# Patient Record
Sex: Female | Born: 1990 | Race: White | Hispanic: Yes | Marital: Married | Smoking: Never smoker
Health system: Southern US, Community
[De-identification: ages and names within clinical notes are randomized; demographics above are authoritative.]

## PROBLEM LIST (undated history)

## (undated) DIAGNOSIS — Z789 Other specified health status: Secondary | ICD-10-CM

## (undated) HISTORY — PX: NO PAST SURGERIES: SHX2092

---
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
## 2009-02-28 ENCOUNTER — Emergency Department (HOSPITAL_COMMUNITY): Admission: EM | Admit: 2009-02-28 | Discharge: 2009-02-28 | Payer: Self-pay | Admitting: Emergency Medicine

## 2010-09-12 ENCOUNTER — Emergency Department (HOSPITAL_COMMUNITY)
Admission: EM | Admit: 2010-09-12 | Discharge: 2010-09-13 | Disposition: A | Discharge status: Home / Self Care | Payer: Self-pay | Attending: Emergency Medicine | Admitting: Emergency Medicine

## 2010-09-12 DIAGNOSIS — R109 Unspecified abdominal pain: Secondary | ICD-10-CM | POA: Insufficient documentation

## 2010-09-12 DIAGNOSIS — O4690 Antepartum hemorrhage, unspecified, unspecified trimester: Secondary | ICD-10-CM | POA: Insufficient documentation

## 2010-09-13 ENCOUNTER — Emergency Department (HOSPITAL_COMMUNITY): Payer: Self-pay

## 2010-09-13 LAB — URINALYSIS, ROUTINE W REFLEX MICROSCOPIC
Bilirubin Urine: NEGATIVE
Nitrite: NEGATIVE
Specific Gravity, Urine: 1.024 (ref 1.005–1.030)
Urobilinogen, UA: 0.2 mg/dL (ref 0.0–1.0)
pH: 5.5 (ref 5.0–8.0)

## 2010-09-13 LAB — WET PREP, GENITAL

## 2010-09-13 LAB — URINE MICROSCOPIC-ADD ON

## 2010-09-13 LAB — POCT I-STAT, CHEM 8
BUN: 10 mg/dL (ref 6–23)
Calcium, Ion: 1.18 mmol/L (ref 1.12–1.32)
Chloride: 104 mEq/L (ref 96–112)
Creatinine, Ser: 0.7 mg/dL (ref 0.4–1.2)
Glucose, Bld: 91 mg/dL (ref 70–99)
TCO2: 24 mmol/L (ref 0–100)

## 2010-09-13 LAB — GC/CHLAMYDIA PROBE AMP, GENITAL: GC Probe Amp, Genital: NEGATIVE

## 2010-09-13 LAB — POCT PREGNANCY, URINE: Preg Test, Ur: POSITIVE

## 2010-09-16 ENCOUNTER — Inpatient Hospital Stay (HOSPITAL_COMMUNITY)
Admission: AD | Admit: 2010-09-16 | Discharge: 2010-09-16 | Disposition: A | Discharge status: Home / Self Care | Payer: Self-pay | Source: Ambulatory Visit | Attending: Obstetrics & Gynecology | Admitting: Obstetrics & Gynecology

## 2010-09-16 ENCOUNTER — Inpatient Hospital Stay (HOSPITAL_COMMUNITY): Admission: AD | Admit: 2010-09-16 | Payer: Self-pay | Source: Ambulatory Visit | Admitting: Obstetrics & Gynecology

## 2010-09-16 DIAGNOSIS — O039 Complete or unspecified spontaneous abortion without complication: Secondary | ICD-10-CM | POA: Insufficient documentation

## 2010-09-18 ENCOUNTER — Inpatient Hospital Stay (HOSPITAL_COMMUNITY)
Admission: AD | Admit: 2010-09-18 | Discharge: 2010-09-18 | Disposition: A | Discharge status: Home / Self Care | Payer: Self-pay | Source: Ambulatory Visit | Attending: Obstetrics & Gynecology | Admitting: Obstetrics & Gynecology

## 2010-09-18 DIAGNOSIS — O209 Hemorrhage in early pregnancy, unspecified: Secondary | ICD-10-CM

## 2010-09-18 LAB — ABO/RH: ABO/RH(D): O POS

## 2010-09-20 ENCOUNTER — Inpatient Hospital Stay (HOSPITAL_COMMUNITY)
Admission: AD | Admit: 2010-09-20 | Discharge: 2010-09-20 | Disposition: A | Discharge status: Home / Self Care | Payer: Self-pay | Source: Ambulatory Visit | Attending: Obstetrics and Gynecology | Admitting: Obstetrics and Gynecology

## 2010-09-20 DIAGNOSIS — O00109 Unspecified tubal pregnancy without intrauterine pregnancy: Secondary | ICD-10-CM

## 2010-09-20 LAB — CBC
Hemoglobin: 11.5 g/dL — ABNORMAL LOW (ref 12.0–15.0)
MCH: 29.4 pg (ref 26.0–34.0)
MCV: 89.5 fL (ref 78.0–100.0)
RBC: 3.91 MIL/uL (ref 3.87–5.11)
WBC: 9.8 10*3/uL (ref 4.0–10.5)

## 2010-09-20 LAB — CREATININE, SERUM
Creatinine, Ser: 0.54 mg/dL (ref 0.4–1.2)
GFR calc Af Amer: >60 mL/min (ref >60)
GFR calc non Af Amer: >60 mL/min (ref >60)

## 2010-09-20 LAB — DIFFERENTIAL
Basophils Relative: 0 % (ref 0–1)
Lymphs Abs: 1.8 10*3/uL (ref 0.7–4.0)
Monocytes Relative: 7 % (ref 3–12)
Neutro Abs: 7.2 10*3/uL (ref 1.7–7.7)
Neutrophils Relative %: 74 % (ref 43–77)

## 2010-09-20 LAB — HCG, QUANTITATIVE, PREGNANCY: hCG, Beta Chain, Quant, S: 54 m[IU]/mL — ABNORMAL HIGH (ref <5)

## 2010-09-20 LAB — AST: AST: 12 U/L (ref 0–37)

## 2010-09-23 ENCOUNTER — Inpatient Hospital Stay (HOSPITAL_COMMUNITY): Admission: AD | Admit: 2010-09-23 | Payer: Self-pay | Source: Ambulatory Visit | Admitting: Obstetrics and Gynecology

## 2010-09-23 ENCOUNTER — Inpatient Hospital Stay (HOSPITAL_COMMUNITY)
Admission: AD | Admit: 2010-09-23 | Discharge: 2010-09-23 | Disposition: A | Discharge status: Home / Self Care | Payer: Self-pay | Source: Ambulatory Visit | Attending: Obstetrics and Gynecology | Admitting: Obstetrics and Gynecology

## 2010-09-23 DIAGNOSIS — O00109 Unspecified tubal pregnancy without intrauterine pregnancy: Secondary | ICD-10-CM | POA: Insufficient documentation

## 2010-09-26 ENCOUNTER — Inpatient Hospital Stay (HOSPITAL_COMMUNITY)
Admission: EM | Admit: 2010-09-26 | Discharge: 2010-09-26 | Disposition: A | Discharge status: Home / Self Care | Payer: Self-pay | Source: Ambulatory Visit | Attending: Obstetrics & Gynecology | Admitting: Obstetrics & Gynecology

## 2010-09-26 DIAGNOSIS — O00109 Unspecified tubal pregnancy without intrauterine pregnancy: Secondary | ICD-10-CM | POA: Insufficient documentation

## 2010-10-03 ENCOUNTER — Inpatient Hospital Stay (HOSPITAL_COMMUNITY)
Admission: AD | Admit: 2010-10-03 | Discharge: 2010-10-03 | Disposition: A | Discharge status: Home / Self Care | Payer: Self-pay | Source: Ambulatory Visit | Attending: Obstetrics & Gynecology | Admitting: Obstetrics & Gynecology

## 2010-10-03 DIAGNOSIS — O039 Complete or unspecified spontaneous abortion without complication: Secondary | ICD-10-CM

## 2010-10-03 LAB — HCG, QUANTITATIVE, PREGNANCY: hCG, Beta Chain, Quant, S: 1 m[IU]/mL (ref <5)

## 2010-10-22 ENCOUNTER — Ambulatory Visit: Payer: Self-pay | Admitting: Family Medicine

## 2011-01-28 LAB — CBC
HCT: 34.2 — ABNORMAL LOW
Hemoglobin: 11.5 — ABNORMAL LOW
MCHC: 33.7
MCHC: 34.2
MCV: 88.7
Platelets: 204
Platelets: 229
RBC: 3.28 — ABNORMAL LOW
RDW: 13
RDW: 13.1

## 2012-07-16 IMAGING — US US OB COMP LESS 14 WK
1 series · 14 of 28 positions shown · non-contrast
Comparison: None.

CLINICAL DATA: Pregnant patient with pain.  Quantitative HCG 128.

OBSTETRIC <14 WK US AND TRANSVAGINAL OB US
TECHNIQUE: Both transabdominal and transvaginal ultrasound
examinations were performed for complete evaluation of the
gestation as well as the maternal uterus, adnexal regions, and
pelvic cul-de-sac.  Transvaginal technique was performed to assess
early pregnancy.

[Series 1: us ob comp less 14 wk · 0.26mm/px · 14 of 40 slices shown]
[im 2/40]
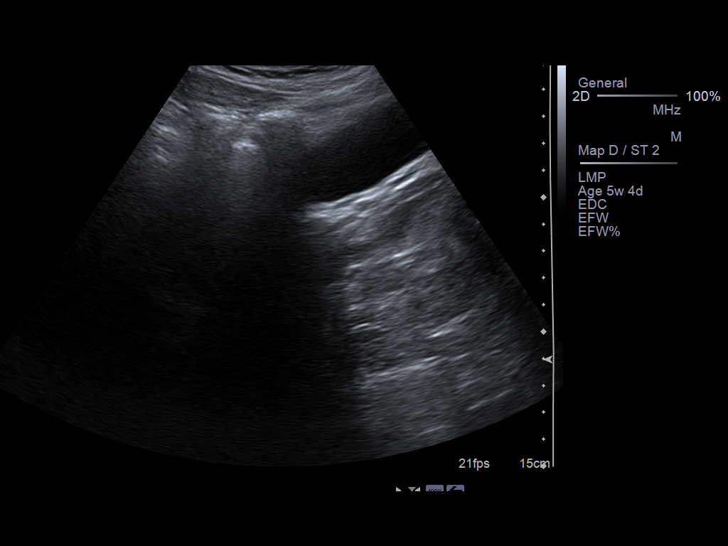
[im 5/40]
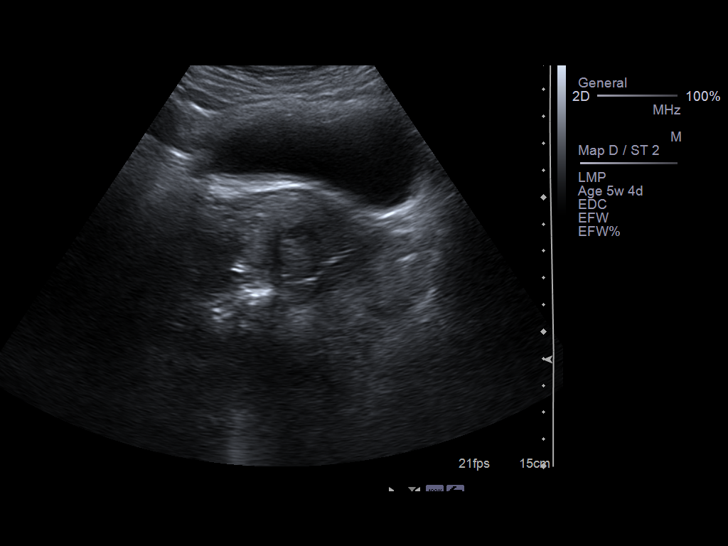
[im 8/40]
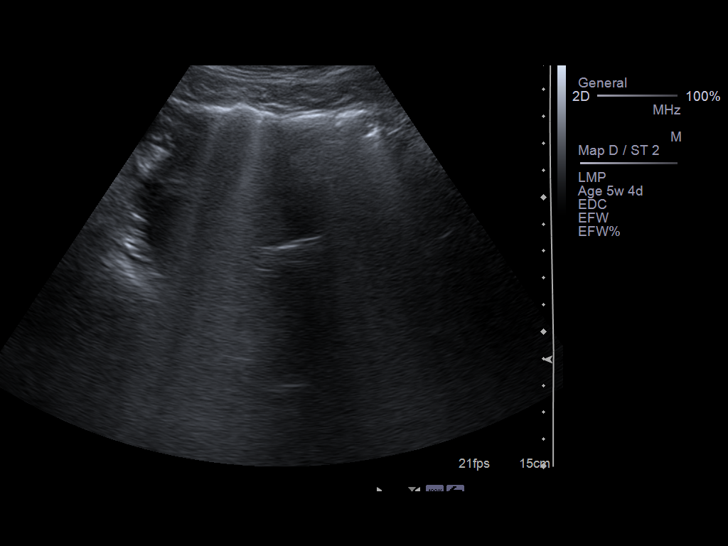
[im 11/40]
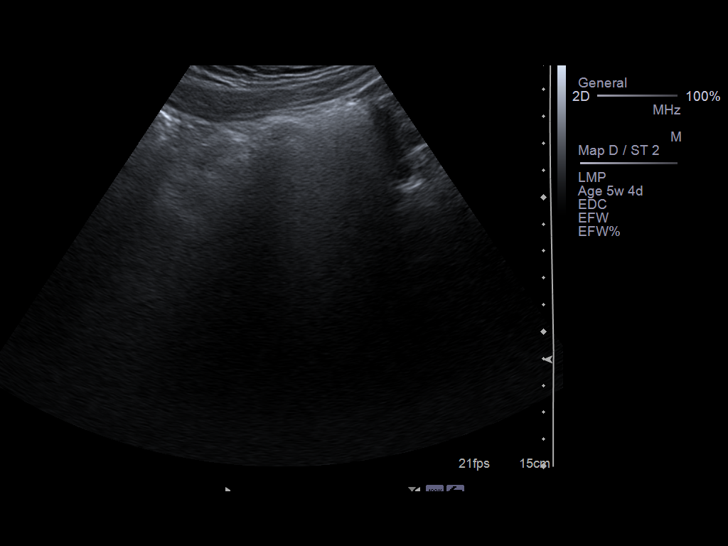
[im 14/40]
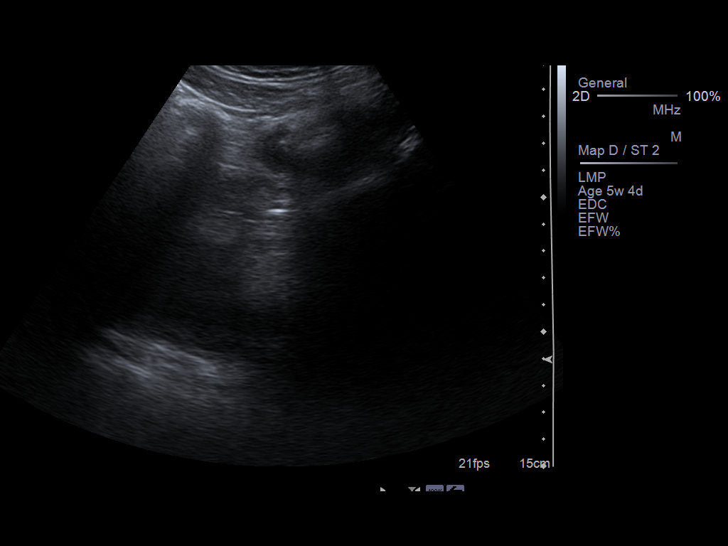
[im 16/40]
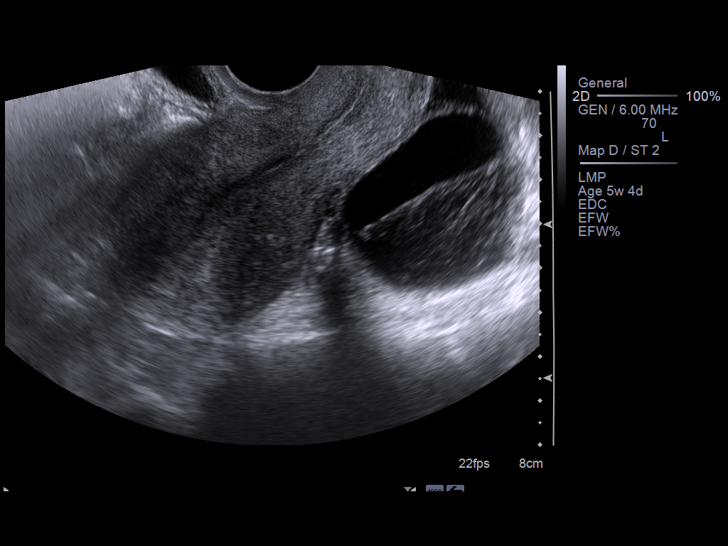
[im 19/40]
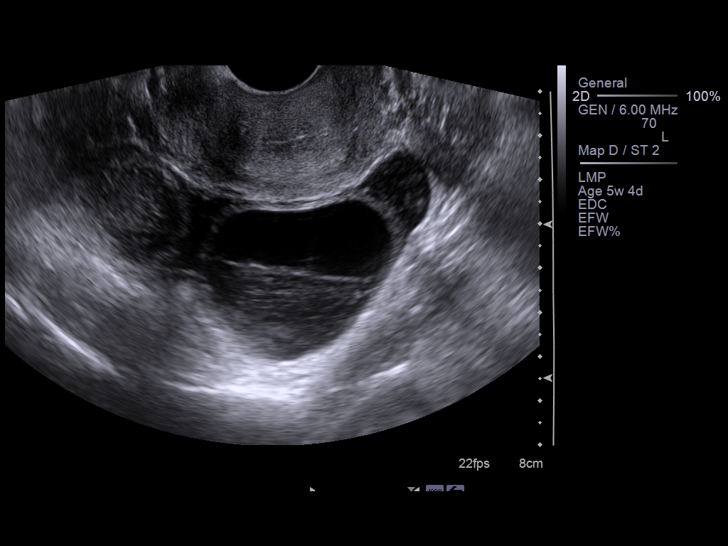
[im 22/40]
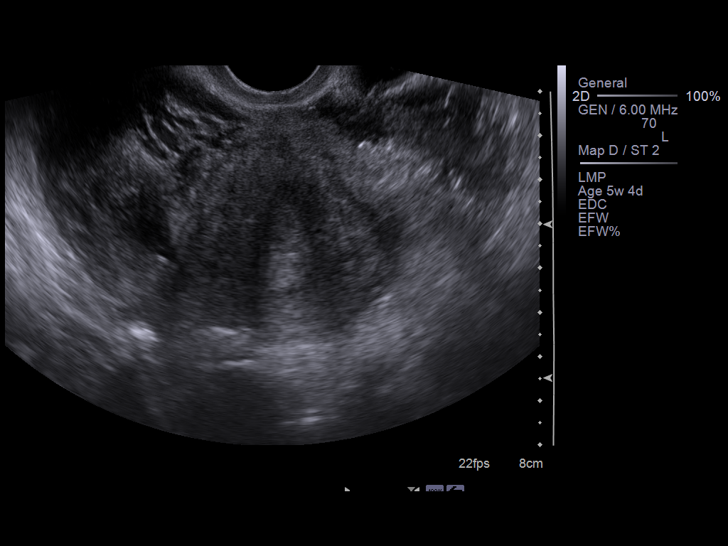
[im 25/40]
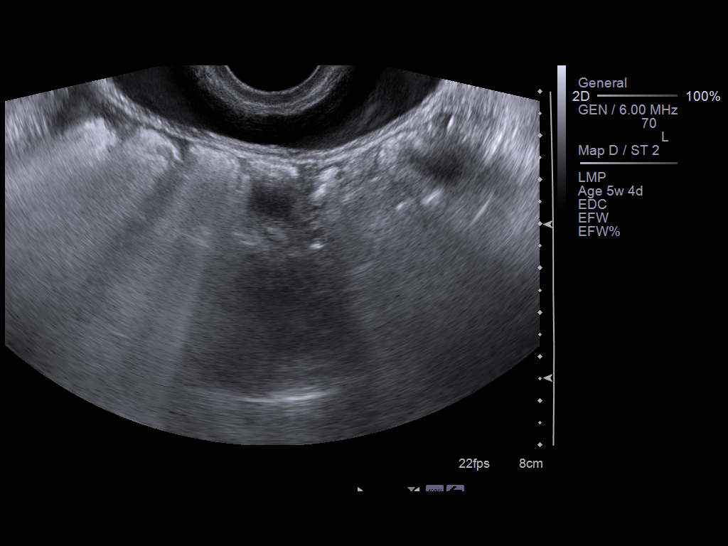
[im 28/40]
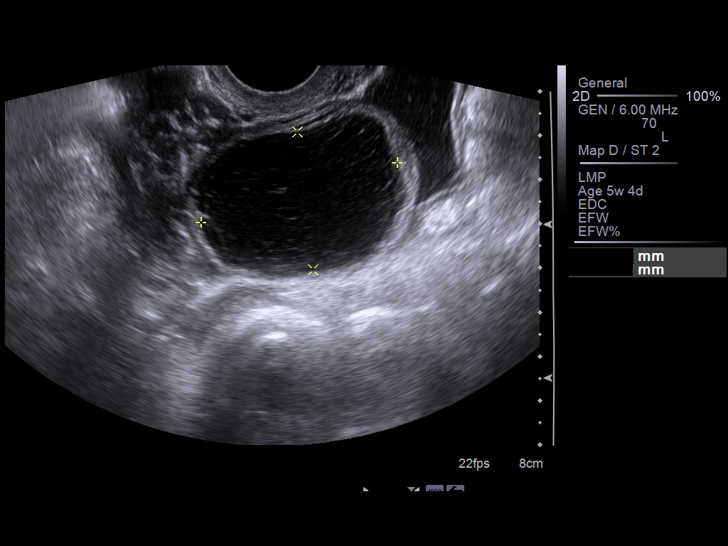
[im 31/40]
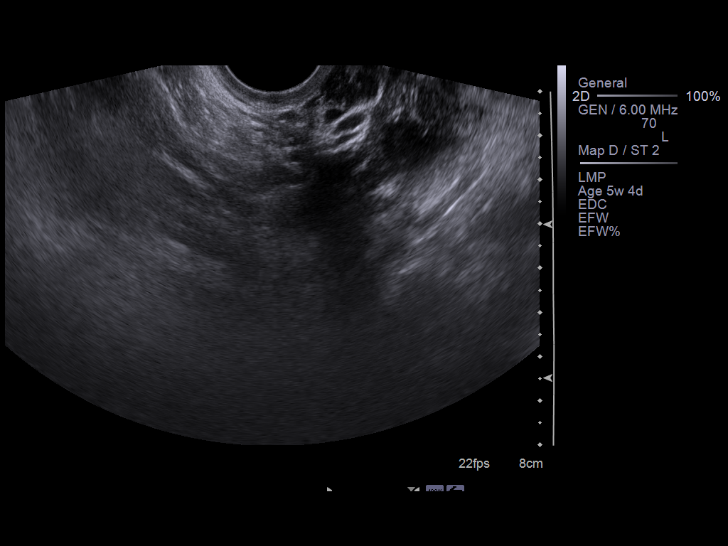
[im 34/40]
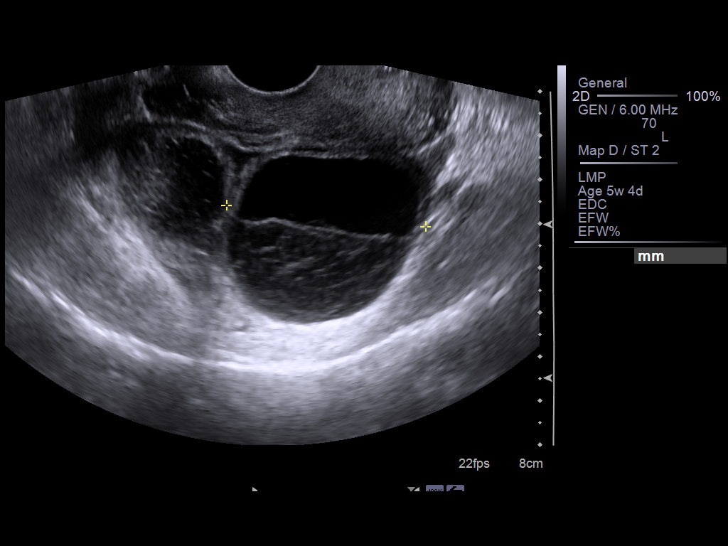
[im 37/40]
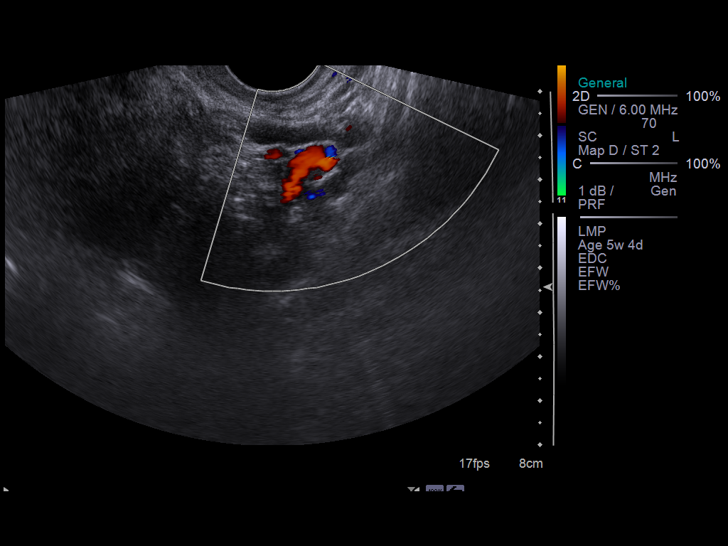
[im 40/40]
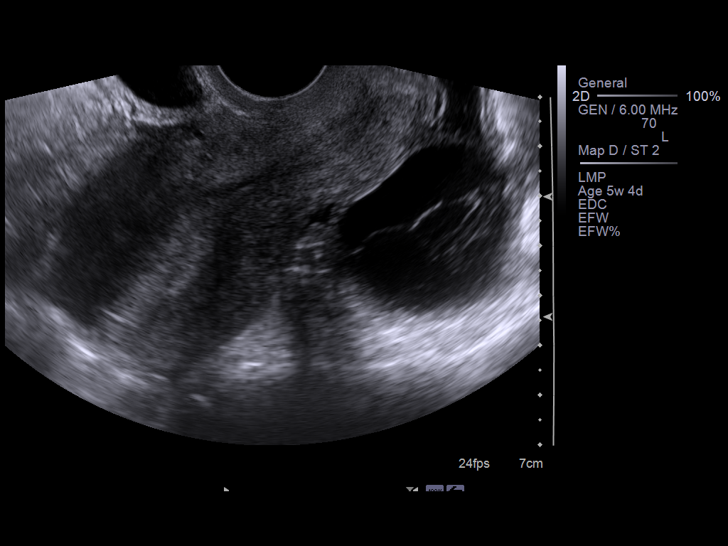

[14 of 28 positions shown; findings below may reference images not displayed]

FINDINGS: No intrauterine pregnancy is identified.  The right
ovary measures 5.5 x 3.8 x 4.8 cm.  There is a right ovarian cyst
measuring 4.6 x 3.1 x 3.9 cm with multiple internal Samsonaite echoes.
The left ovary measures 5.1 x 3.9 x 4.5 cm.  A cyst with multiple
Samsonaite internal echoes measures 4.6 x 3.6 x 4.0 cm.  There is a fluid
debris level present.  Small amount of free pelvic fluid is noted.
IMPRESSION: 1.  No evidence of pregnancy.  Given low quantitative HCG,
differential considerations include early IUP, ectopic pregnancy or
spontaneous abortion.  Recommend follow-up serial quantitative HCG.
2.  Bilateral cysts in the ovaries with an appearance most
compatible with hemorrhagic cysts.  Follow-up pelvic ultrasound in
6 weeks is recommended.

## 2012-09-23 ENCOUNTER — Inpatient Hospital Stay (HOSPITAL_COMMUNITY): Payer: Self-pay

## 2012-09-23 ENCOUNTER — Inpatient Hospital Stay (HOSPITAL_COMMUNITY)
Admission: AD | Admit: 2012-09-23 | Discharge: 2012-09-23 | Disposition: A | Discharge status: Home / Self Care | Payer: Self-pay | Source: Ambulatory Visit | Attending: Obstetrics and Gynecology | Admitting: Obstetrics and Gynecology

## 2012-09-23 ENCOUNTER — Encounter (HOSPITAL_COMMUNITY): Payer: Self-pay | Admitting: *Deleted

## 2012-09-23 DIAGNOSIS — O209 Hemorrhage in early pregnancy, unspecified: Secondary | ICD-10-CM | POA: Insufficient documentation

## 2012-09-23 DIAGNOSIS — O210 Mild hyperemesis gravidarum: Secondary | ICD-10-CM | POA: Insufficient documentation

## 2012-09-23 DIAGNOSIS — O418X10 Other specified disorders of amniotic fluid and membranes, first trimester, not applicable or unspecified: Secondary | ICD-10-CM

## 2012-09-23 DIAGNOSIS — O468X9 Other antepartum hemorrhage, unspecified trimester: Secondary | ICD-10-CM

## 2012-09-23 DIAGNOSIS — R109 Unspecified abdominal pain: Secondary | ICD-10-CM | POA: Insufficient documentation

## 2012-09-23 HISTORY — DX: Other specified health status: Z78.9

## 2012-09-23 LAB — POCT PREGNANCY, URINE: Preg Test, Ur: POSITIVE — AB

## 2012-09-23 LAB — CBC
HCT: 33.2 % — ABNORMAL LOW (ref 36.0–46.0)
MCV: 87.4 fL (ref 78.0–100.0)
RBC: 3.8 MIL/uL — ABNORMAL LOW (ref 3.87–5.11)
RDW: 13.2 % (ref 11.5–15.5)
WBC: 10.1 10*3/uL (ref 4.0–10.5)

## 2012-09-23 LAB — URINALYSIS, ROUTINE W REFLEX MICROSCOPIC
Glucose, UA: NEGATIVE mg/dL
Ketones, ur: NEGATIVE mg/dL
Leukocytes, UA: NEGATIVE
Nitrite: NEGATIVE
Protein, ur: NEGATIVE mg/dL
Urobilinogen, UA: 0.2 mg/dL (ref 0.0–1.0)

## 2012-09-23 LAB — WET PREP, GENITAL: Clue Cells Wet Prep HPF POC: NONE SEEN

## 2012-09-23 MED ORDER — PROMETHAZINE HCL 25 MG PO TABS: 25.0000 mg | ORAL_TABLET | Freq: Four times a day (QID) | ORAL | Status: DC | PRN

## 2012-09-23 MED ORDER — ONDANSETRON HCL 4 MG PO TABS: 4.0000 mg | ORAL_TABLET | Freq: Four times a day (QID) | ORAL | Status: DC

## 2012-09-23 NOTE — Progress Notes (Signed)
Raynelle Fanning Either PA in to discuss lab and u/s results with pt. Written and verbal d/c instructions given and understanding voiced.

## 2012-09-23 NOTE — MAU Note (Signed)
Vaginal bleeding started couple hours ago. Dark red when wipes. Has had cramps in past but not today. Dizziness sometimes.

## 2012-09-23 NOTE — MAU Provider Note (Signed)
History     CSN: [id number]  Arrival date and time: 09/23/12 2129   First Provider Initiated Contact with Patient 09/23/12 2206      Chief Complaint  Patient presents with  . Vaginal Bleeding   HPI Ms. Jennifer Watson is a 22 y.o. [phone] at [redacted]w[redacted]d who presents to MAU today with complaint of vaginal bleeding. The patient states that she started bleeding 2-3 hours ago. She states that it is a little less than a normal period. She denies pain, although she has had occasional lower abdominal cramps recently. She has also had N/V regularly over the last few weeks, but none now. She has had dizziness and dysuria, frequency and urgency today. She denies other vaginal discharge, diarrhea, constipation or fever.   OB History   Grav Para Term Preterm Abortions TAB SAB Ect Mult Living   5 2 2  1  1   2       Past Medical History  Diagnosis Date  . Medical history non-contributory     Past Surgical History  Procedure Laterality Date  . No past surgeries      Family History  Problem Relation Age of Onset  . Cancer Maternal Grandfather   . Diabetes Paternal Grandmother     History  Substance Use Topics  . Smoking status: Never Smoker   . Smokeless tobacco: Not on file  . Alcohol Use: No    Allergies: No Known Allergies  No prescriptions prior to admission    Review of Systems  Constitutional: Negative for fever and malaise/fatigue.  Gastrointestinal: Positive for nausea and vomiting. Negative for abdominal pain, diarrhea and constipation.  Genitourinary: Positive for dysuria, urgency and frequency.       Neg - vaginal discharge + vaginal bleeding  Neurological: Positive for dizziness. Negative for loss of consciousness.   Physical Exam   Blood pressure 114/59, pulse 61, temperature 98.3 F (36.8 C), temperature source Oral, resp. rate 18, last menstrual period 08/14/2012, SpO2 100.00%.  Physical Exam  Constitutional: She is oriented to person, place, and time.  She appears well-developed and well-nourished. No distress.  HENT:  Head: Normocephalic and atraumatic.  Cardiovascular: Normal rate, regular rhythm and normal heart sounds.   Respiratory: Effort normal and breath sounds normal. No respiratory distress.  GI: Soft. Bowel sounds are normal. She exhibits no distension and no mass. There is tenderness (mild tenderness to palpation of the LLQ). There is no rebound and no guarding.  Genitourinary: Uterus is enlarged. Uterus is not tender. Cervix exhibits no motion tenderness, no discharge and no friability. Right adnexum displays no mass and no tenderness. Left adnexum displays no mass and no tenderness. There is bleeding (small amount of bleeding noted in the vagina) around the vagina. No vaginal discharge found.  Neurological: She is alert and oriented to person, place, and time.  Skin: Skin is warm and dry. No erythema.  Psychiatric: She has a normal mood and affect.   Results for orders placed during the hospital encounter of 09/23/12 (from the past 24 hour(s))  URINALYSIS, ROUTINE W REFLEX MICROSCOPIC     Status: Abnormal   Collection Time    09/23/12  9:46 PM      Result Value Range   Color, Urine YELLOW  YELLOW   APPearance CLEAR  CLEAR   Specific Gravity, Urine 1.025  1.005 - 1.030   pH 6.0  5.0 - 8.0   Glucose, UA NEGATIVE  NEGATIVE mg/dL   Hgb urine dipstick LARGE (*) NEGATIVE  Bilirubin Urine NEGATIVE  NEGATIVE   Ketones, ur NEGATIVE  NEGATIVE mg/dL   Protein, ur NEGATIVE  NEGATIVE mg/dL   Urobilinogen, UA 0.2  0.0 - 1.0 mg/dL   Nitrite NEGATIVE  NEGATIVE   Leukocytes, UA NEGATIVE  NEGATIVE  URINE MICROSCOPIC-ADD ON     Status: Abnormal   Collection Time    09/23/12  9:46 PM      Result Value Range   Squamous Epithelial / LPF RARE  RARE   WBC, UA    <3 WBC/hpf   Value: NO FORMED ELEMENTS SEEN ON URINE MICROSCOPIC EXAMINATION   RBC / HPF 3-6  <3 RBC/hpf   Bacteria, UA FEW (*) RARE  POCT PREGNANCY, URINE     Status: Abnormal     Collection Time    09/23/12 10:05 PM      Result Value Range   Preg Test, Ur POSITIVE (*) NEGATIVE  WET PREP, GENITAL     Status: None   Collection Time    09/23/12 10:26 PM      Result Value Range   Yeast Wet Prep HPF POC NONE SEEN  NONE SEEN   Trich, Wet Prep NONE SEEN  NONE SEEN   Clue Cells Wet Prep HPF POC NONE SEEN  NONE SEEN   WBC, Wet Prep HPF POC NONE SEEN  NONE SEEN  CBC     Status: Abnormal   Collection Time    09/23/12 10:26 PM      Result Value Range   WBC 10.1  4.0 - 10.5 K/uL   RBC 3.80 (*) 3.87 - 5.11 MIL/uL   Hemoglobin 11.4 (*) 12.0 - 15.0 g/dL   HCT 08.6 (*) 57.8 - 46.9 %   MCV 87.4  78.0 - 100.0 fL   MCH 30.0  26.0 - 34.0 pg   MCHC 34.3  30.0 - 36.0 g/dL   RDW 62.9  52.8 - 41.3 %   Platelets 255  150 - 400 K/uL  HCG, QUANTITATIVE, PREGNANCY     Status: Abnormal   Collection Time    09/23/12 10:27 PM      Result Value Range   hCG, Beta Chain, Quant, S [id number] (*) <5 mIU/mL      MAU Course  Procedures None  MDM +UPT UA, Wet prep, GC/Chlamydia, CBC, quant hCG and [place] today O+ blood type from previous visit in Epic  Assessment and Plan  A: Bleeding in early pregnancy Nausea and vomiting in early pregnancy Subchorionic hemorrhage  P: Discharge home Bleeding precautions discussed Rx for Phenergan and Zofran sent to patient's pharmacy Pregnancy confirmation letter given with list of OB providers and Medicaid instructions Patient may return to MAU as needed or if her condition were to change or worsen  Freddi Starr, PA-C  09/23/2012, 11:30 PM

## 2012-09-24 LAB — GC/CHLAMYDIA PROBE AMP: GC Probe RNA: NEGATIVE

## 2012-09-25 NOTE — MAU Provider Note (Signed)
Attestation of Attending Supervision of Advanced Practitioner: Evaluation and management procedures were performed by the PA/NP/CNM/OB Fellow under my supervision/collaboration. Chart reviewed and agree with management and plan.  Tilda Burrow 09/25/2012 6:26 PM

## 2013-01-02 ENCOUNTER — Other Ambulatory Visit (HOSPITAL_COMMUNITY): Payer: Self-pay | Admitting: Nurse Practitioner

## 2013-01-02 DIAGNOSIS — Z3689 Encounter for other specified antenatal screening: Secondary | ICD-10-CM

## 2013-01-02 LAB — OB RESULTS CONSOLE HEPATITIS B SURFACE ANTIGEN: HEP B S AG: NEGATIVE

## 2013-01-02 LAB — OB RESULTS CONSOLE ANTIBODY SCREEN: ANTIBODY SCREEN: NEGATIVE

## 2013-01-02 LAB — OB RESULTS CONSOLE HIV ANTIBODY (ROUTINE TESTING): HIV: NONREACTIVE

## 2013-01-02 LAB — OB RESULTS CONSOLE RPR
RPR: NONREACTIVE
RPR: NONREACTIVE

## 2013-01-02 LAB — OB RESULTS CONSOLE GC/CHLAMYDIA
Chlamydia: NEGATIVE
Gonorrhea: NEGATIVE

## 2013-01-02 LAB — OB RESULTS CONSOLE VARICELLA ZOSTER ANTIBODY, IGG: Varicella: NON-IMMUNE/NOT IMMUNE

## 2013-01-02 LAB — OB RESULTS CONSOLE RUBELLA ANTIBODY, IGM: Rubella: IMMUNE

## 2013-01-03 ENCOUNTER — Other Ambulatory Visit (HOSPITAL_COMMUNITY): Payer: Self-pay | Admitting: Nurse Practitioner

## 2013-01-03 ENCOUNTER — Ambulatory Visit (HOSPITAL_COMMUNITY)
Admission: RE | Admit: 2013-01-03 | Discharge: 2013-01-03 | Disposition: A | Discharge status: Home / Self Care | Payer: Medicaid Other | Source: Ambulatory Visit | Attending: Nurse Practitioner | Admitting: Nurse Practitioner

## 2013-01-03 DIAGNOSIS — Z3689 Encounter for other specified antenatal screening: Secondary | ICD-10-CM | POA: Insufficient documentation

## 2013-04-24 LAB — OB RESULTS CONSOLE GBS: STREP GROUP B AG: NEGATIVE

## 2013-04-27 NOTE — L&D Delivery Note (Signed)
Rapid progression of mother once arrived to L&D. AROM at bedside and pt pushed several times to a successful NSVD over intact perinum. Active manamgent of 3rd stage with pit and traction delivered intact placenta with 3v cord. No tears, Hemostatic, EBL 300, counts correct. I was present for the entire delivery. Dr. Richarda BladeAdamo was provider delivering.  Tawana ScaleMichael Ryan Nyjae Hodge, MD OB Fellow

## 2013-04-27 NOTE — L&D Delivery Note (Signed)
Delivery Note At 11:06 AM a viable and healthy female was delivered via Vaginal, Spontaneous Delivery (Presentation: Left Occiput Anterior).  APGAR: 9, 9; weight pending.   Placenta status: delivered, intact, .  Cord: 3 vessels with the following complications: none.   Anesthesia: Epidural, IV fentanyl  Episiotomy: None Lacerations: none Suture Repair: na Est. Blood Loss (mL): 300  Mom to postpartum.  Baby to Couplet care / Skin to Skin. Placenta to birthing suites  Beverely Lowdamo, Elena 05/11/2013, 11:20 AM

## 2013-05-11 ENCOUNTER — Encounter (HOSPITAL_COMMUNITY): Payer: Medicaid Other | Admitting: Anesthesiology

## 2013-05-11 ENCOUNTER — Inpatient Hospital Stay (HOSPITAL_COMMUNITY): Payer: Medicaid Other | Admitting: Anesthesiology

## 2013-05-11 ENCOUNTER — Inpatient Hospital Stay (HOSPITAL_COMMUNITY)
Admission: AD | Admit: 2013-05-11 | Discharge: 2013-05-12 | DRG: 775 | Disposition: A | Discharge status: Home / Self Care | Payer: Medicaid Other | Source: Ambulatory Visit | Attending: Obstetrics & Gynecology | Admitting: Obstetrics & Gynecology

## 2013-05-11 ENCOUNTER — Encounter (HOSPITAL_COMMUNITY): Payer: Self-pay | Admitting: *Deleted

## 2013-05-11 DIAGNOSIS — IMO0001 Reserved for inherently not codable concepts without codable children: Secondary | ICD-10-CM

## 2013-05-11 LAB — CBC
HEMATOCRIT: 35.6 % — AB (ref 36.0–46.0)
Hemoglobin: 12.3 g/dL (ref 12.0–15.0)
MCH: 30.8 pg (ref 26.0–34.0)
MCHC: 34.6 g/dL (ref 30.0–36.0)
MCV: 89 fL (ref 78.0–100.0)
Platelets: 227 10*3/uL (ref 150–400)
RBC: 4 MIL/uL (ref 3.87–5.11)
RDW: 13.1 % (ref 11.5–15.5)
WBC: 10.4 10*3/uL (ref 4.0–10.5)

## 2013-05-11 LAB — TYPE AND SCREEN
ABO/RH(D): O POS
ANTIBODY SCREEN: NEGATIVE

## 2013-05-11 LAB — HIV ANTIBODY (ROUTINE TESTING W REFLEX): HIV: NONREACTIVE

## 2013-05-11 LAB — RPR: RPR Ser Ql: NONREACTIVE

## 2013-05-11 MED ORDER — ONDANSETRON HCL 4 MG/2ML IJ SOLN: 4.0000 mg | Freq: Four times a day (QID) | INTRAMUSCULAR | Status: DC | PRN

## 2013-05-11 MED ORDER — LACTATED RINGERS IV SOLN
500.0000 mL | Freq: Once | INTRAVENOUS | Status: AC
  Administered 2013-05-11: 500 mL via INTRAVENOUS

## 2013-05-11 MED ORDER — ZOLPIDEM TARTRATE 5 MG PO TABS: 5.0000 mg | ORAL_TABLET | Freq: Every evening | ORAL | Status: DC | PRN

## 2013-05-11 MED ORDER — FENTANYL CITRATE 0.05 MG/ML IJ SOLN
100.0000 ug | INTRAMUSCULAR | Status: DC | PRN
  Administered 2013-05-11: 100 ug via INTRAVENOUS

## 2013-05-11 MED ORDER — FENTANYL CITRATE 0.05 MG/ML IJ SOLN
INTRAMUSCULAR | Status: AC
  Filled 2013-05-11: qty 2

## 2013-05-11 MED ORDER — DIPHENHYDRAMINE HCL 50 MG/ML IJ SOLN: 12.5000 mg | INTRAMUSCULAR | Status: DC | PRN

## 2013-05-11 MED ORDER — DIPHENHYDRAMINE HCL 25 MG PO CAPS: 25.0000 mg | ORAL_CAPSULE | Freq: Four times a day (QID) | ORAL | Status: DC | PRN

## 2013-05-11 MED ORDER — OXYCODONE-ACETAMINOPHEN 5-325 MG PO TABS: 1.0000 | ORAL_TABLET | ORAL | Status: DC | PRN

## 2013-05-11 MED ORDER — IBUPROFEN 600 MG PO TABS
600.0000 mg | ORAL_TABLET | Freq: Four times a day (QID) | ORAL | Status: DC | PRN
Start: 2013-05-11 — End: 2013-05-11

## 2013-05-11 MED ORDER — OXYTOCIN 40 UNITS IN LACTATED RINGERS INFUSION - SIMPLE MED
62.5000 mL/h | INTRAVENOUS | Status: DC
  Administered 2013-05-11: 62.5 mL/h via INTRAVENOUS
  Filled 2013-05-11: qty 1000

## 2013-05-11 MED ORDER — PRENATAL MULTIVITAMIN CH
1.0000 | ORAL_TABLET | Freq: Every day | ORAL | Status: DC
  Administered 2013-05-12: 1 via ORAL
  Filled 2013-05-11: qty 1

## 2013-05-11 MED ORDER — EPHEDRINE 5 MG/ML INJ
10.0000 mg | INTRAVENOUS | Status: DC | PRN
  Filled 2013-05-11: qty 2

## 2013-05-11 MED ORDER — ONDANSETRON HCL 4 MG PO TABS: 4.0000 mg | ORAL_TABLET | ORAL | Status: DC | PRN

## 2013-05-11 MED ORDER — FENTANYL 2.5 MCG/ML BUPIVACAINE 1/10 % EPIDURAL INFUSION (WH - ANES)
INTRAMUSCULAR | Status: DC | PRN
  Administered 2013-05-11: 14 mL/h via EPIDURAL

## 2013-05-11 MED ORDER — CITRIC ACID-SODIUM CITRATE 334-500 MG/5ML PO SOLN: 30.0000 mL | ORAL | Status: DC | PRN

## 2013-05-11 MED ORDER — TETANUS-DIPHTH-ACELL PERTUSSIS 5-2.5-18.5 LF-MCG/0.5 IM SUSP: 0.5000 mL | Freq: Once | INTRAMUSCULAR | Status: DC

## 2013-05-11 MED ORDER — SIMETHICONE 80 MG PO CHEW: 80.0000 mg | CHEWABLE_TABLET | ORAL | Status: DC | PRN

## 2013-05-11 MED ORDER — LACTATED RINGERS IV SOLN
500.0000 mL | INTRAVENOUS | Status: DC | PRN
  Administered 2013-05-11: 500 mL via INTRAVENOUS

## 2013-05-11 MED ORDER — DIBUCAINE 1 % RE OINT: 1.0000 "application " | TOPICAL_OINTMENT | RECTAL | Status: DC | PRN

## 2013-05-11 MED ORDER — EPHEDRINE 5 MG/ML INJ
10.0000 mg | INTRAVENOUS | Status: DC | PRN
  Filled 2013-05-11: qty 4
  Filled 2013-05-11: qty 2

## 2013-05-11 MED ORDER — IBUPROFEN 600 MG PO TABS
600.0000 mg | ORAL_TABLET | Freq: Four times a day (QID) | ORAL | Status: DC
  Administered 2013-05-11 – 2013-05-12 (×3): 600 mg via ORAL
  Filled 2013-05-11 (×3): qty 1

## 2013-05-11 MED ORDER — LIDOCAINE HCL (PF) 1 % IJ SOLN
30.0000 mL | INTRAMUSCULAR | Status: DC | PRN
  Filled 2013-05-11 (×2): qty 30

## 2013-05-11 MED ORDER — PHENYLEPHRINE 40 MCG/ML (10ML) SYRINGE FOR IV PUSH (FOR BLOOD PRESSURE SUPPORT)
80.0000 ug | PREFILLED_SYRINGE | INTRAVENOUS | Status: DC | PRN
  Filled 2013-05-11: qty 10
  Filled 2013-05-11: qty 2

## 2013-05-11 MED ORDER — BENZOCAINE-MENTHOL 20-0.5 % EX AERO: 1.0000 "application " | INHALATION_SPRAY | CUTANEOUS | Status: DC | PRN

## 2013-05-11 MED ORDER — SENNOSIDES-DOCUSATE SODIUM 8.6-50 MG PO TABS
2.0000 | ORAL_TABLET | ORAL | Status: DC
  Filled 2013-05-11: qty 2

## 2013-05-11 MED ORDER — FENTANYL 2.5 MCG/ML BUPIVACAINE 1/10 % EPIDURAL INFUSION (WH - ANES)
14.0000 mL/h | INTRAMUSCULAR | Status: DC | PRN
Start: 2013-05-11 — End: 2013-05-11
  Filled 2013-05-11: qty 125

## 2013-05-11 MED ORDER — LACTATED RINGERS IV SOLN
INTRAVENOUS | Status: DC
  Administered 2013-05-11 (×2): via INTRAVENOUS

## 2013-05-11 MED ORDER — ACETAMINOPHEN 325 MG PO TABS: 650.0000 mg | ORAL_TABLET | ORAL | Status: DC | PRN

## 2013-05-11 MED ORDER — LIDOCAINE HCL (PF) 1 % IJ SOLN
INTRAMUSCULAR | Status: DC | PRN
  Administered 2013-05-11 (×2): 4 mL

## 2013-05-11 MED ORDER — WITCH HAZEL-GLYCERIN EX PADS: 1.0000 "application " | MEDICATED_PAD | CUTANEOUS | Status: DC | PRN

## 2013-05-11 MED ORDER — OXYTOCIN BOLUS FROM INFUSION: 500.0000 mL | INTRAVENOUS | Status: DC

## 2013-05-11 MED ORDER — PHENYLEPHRINE 40 MCG/ML (10ML) SYRINGE FOR IV PUSH (FOR BLOOD PRESSURE SUPPORT)
80.0000 ug | PREFILLED_SYRINGE | INTRAVENOUS | Status: DC | PRN
  Filled 2013-05-11: qty 2

## 2013-05-11 MED ORDER — LANOLIN HYDROUS EX OINT: TOPICAL_OINTMENT | CUTANEOUS | Status: DC | PRN

## 2013-05-11 MED ORDER — ONDANSETRON HCL 4 MG/2ML IJ SOLN: 4.0000 mg | INTRAMUSCULAR | Status: DC | PRN

## 2013-05-11 NOTE — MAU Note (Signed)
Pt. Presents to MAU with c/o contractions every 7-8 minutes since 0130 this AM. Pt states the contractions have gotten stronger throughout the night. States some spotting with light red blood.

## 2013-05-11 NOTE — MAU Note (Signed)
Contractions woke her during the night. Small amt of bleeding. No leaking. Was 1 cm when last checked.

## 2013-05-11 NOTE — H&P (Signed)
Jennifer Watson is a 23 y.o. female presenting for contractions.  Pt reports contractions that started at 0130 today.  Reports that they were irregular and became increasingly stronger at 0530.  No rupture of membranes.  +spotting of blood with wiping.  [facility] onset of care at 20 wks.  Pregnancy is dated by 20 wk ultrasound.  No complications during pregnancy . Maternal Medical History:  Reason for admission: Contractions.   Contractions: Onset was 6-12 hours ago.   Frequency: regular.   Perceived severity is strong.    Prenatal complications: no prenatal complications   OB History   Grav Para Term Preterm Abortions TAB SAB Ect Mult Living   4 2 2  1  1   2      Past Medical History  Diagnosis Date  . Medical history non-contributory    Past Surgical History  Procedure Laterality Date  . No past surgeries     Family History: family history includes Cancer in her maternal grandfather; Diabetes in her paternal grandmother. Social History:  reports that she has never smoked. She does not have any smokeless tobacco history on file. She reports that she does not drink alcohol or use illicit drugs.   Prenatal Transfer Tool  Maternal Diabetes: No Genetic Screening: Normal Maternal Ultrasounds/Referrals: Normal Fetal Ultrasounds or other Referrals:  None Maternal Substance Abuse:  No Significant Maternal Medications:  None Significant Maternal Lab Results:  Lab values include: Group B Strep negative Other Comments:  None  Review of Systems  Gastrointestinal: Positive for abdominal pain (contractions).  All other systems reviewed and are negative.    Dilation: 4.5 Effacement (%): 100 Station: -2 Exam by:: Lucila Maineheryl motte, RN Blood pressure 135/85, pulse 79, temperature 98 F (36.7 C), temperature source Oral, resp. rate 18, height 5\' 5"  (1.651 m), weight 82.101 kg (181 lb), last menstrual period 08/14/2012, SpO2 99.00%. Maternal Exam:   Uterine Assessment: Contraction strength is firm.  Contraction frequency is irregular.   Abdomen: Estimated fetal weight is 7-7.5 lbs.   Fetal presentation: vertex  Introitus: Vagina is positive for vaginal discharge (mucusy).    Fetal Exam Fetal Monitor Review: Baseline rate: 120's.  Variability: moderate (6-25 bpm).   Pattern: accelerations present.    Fetal State Assessment: Category I - tracings are normal.     Physical Exam  Constitutional: She is oriented to person, place, and time. She appears well-developed and well-nourished.  Appears uncomfortable  HENT:  Head: Normocephalic.  Neck: Normal range of motion. Neck supple.  Cardiovascular: Normal rate, regular rhythm and normal heart sounds.   Respiratory: Effort normal and breath sounds normal. No respiratory distress.  GI: Soft. There is no tenderness.  Genitourinary: No bleeding around the vagina. Vaginal discharge (mucusy) found.  Musculoskeletal: Normal range of motion. She exhibits edema (1+ bilat pedal).  Neurological: She is alert and oriented to person, place, and time.  Skin: Skin is warm and dry.    Prenatal labs: ABO, Rh:   Antibody: Negative (09/08 0000) Rubella: Immune (09/08 0000) RPR: Nonreactive, Nonreactive (09/08 0000)  HBsAg: Negative (09/08 0000)  HIV: Non-reactive (09/08 0000)  GBS: Negative (12/29 0000)   Assessment/Plan: 23 yo G4P2012 at 3938w1d wks IUP Labor GBS negative  Admit to Birthing Suites Anticipate NSVD   [facility],WALIDAH 05/11/2013, 8:37 AM

## 2013-05-11 NOTE — Anesthesia Preprocedure Evaluation (Signed)

## 2013-05-11 NOTE — Anesthesia Procedure Notes (Signed)
Epidural Patient location during procedure: OB Start time: 05/11/2013 10:27 AM  Staffing Anesthesiologist: Liliann File A. Performed by: anesthesiologist   Preanesthetic Checklist Completed: patient identified, site marked, surgical consent, pre-op evaluation, timeout performed, IV checked, risks and benefits discussed and monitors and equipment checked  Epidural Patient position: sitting Prep: site prepped and draped and DuraPrep Patient monitoring: continuous pulse ox and blood pressure Approach: midline Injection technique: LOR air  Needle:  Needle type: Tuohy  Needle gauge: 17 G Needle length: 9 cm and 9 Needle insertion depth: 5 cm cm Catheter type: closed end flexible Catheter size: 19 Gauge Catheter at skin depth: 10 cm Test dose: negative and Other  Assessment Events: blood not aspirated, injection not painful, no injection resistance, negative IV test and no paresthesia  Additional Notes Patient identified. Risks and benefits discussed including failed block, incomplete  Pain control, post dural puncture headache, nerve damage, paralysis, blood pressure Changes, nausea, vomiting, reactions to medications-both toxic and allergic and post Partum back pain. All questions were answered. Patient expressed understanding and wished to proceed. Sterile technique was used throughout procedure. Epidural site was Dressed with sterile barrier dressing. No paresthesias, signs of intravascular injection Or signs of intrathecal spread were encountered.  Patient was more comfortable after the epidural was dosed. Please see RN's note for documentation of vital signs and FHR which are stable.

## 2013-05-12 LAB — CBC
HCT: 32.2 % — ABNORMAL LOW (ref 36.0–46.0)
HEMOGLOBIN: 10.9 g/dL — AB (ref 12.0–15.0)
MCH: 30.3 pg (ref 26.0–34.0)
MCHC: 33.9 g/dL (ref 30.0–36.0)
MCV: 89.4 fL (ref 78.0–100.0)
Platelets: 186 10*3/uL (ref 150–400)
RBC: 3.6 MIL/uL — AB (ref 3.87–5.11)
RDW: 13.5 % (ref 11.5–15.5)
WBC: 11.3 10*3/uL — AB (ref 4.0–10.5)

## 2013-05-12 NOTE — Progress Notes (Signed)
UR chart review completed.  

## 2013-05-12 NOTE — Discharge Summary (Signed)
Obstetric Discharge Summary Reason for Admission: onset of labor Prenatal Procedures: ultrasound Intrapartum Procedures: spontaneous vaginal delivery Postpartum Procedures: none Complications-Operative and Postpartum: none Hemoglobin  Date Value Range Status  05/12/2013 10.9* 12.0 - 15.0 g/dL Final     HCT  Date Value Range Status  05/12/2013 32.2* 36.0 - 46.0 % Final    Physical Exam:  General: alert and cooperative Lochia: appropriate Uterine Fundus: firm Incision: N/A DVT Evaluation: No evidence of DVT seen on physical exam.  Discharge Diagnoses: Term Pregnancy-delivered  Discharge Information: Date: 05/12/2013 Activity: unrestricted Diet: routine Medications: PNV Condition: stable Instructions: refer to practice specific booklet Discharge to: home Follow-up Information   [facility] Schedule an appointment as soon as possible for a visit in 6 weeks.   Contact information:   [address] [place] [address] [phone]      Newborn Data: Live born female  Birth Weight: 7 lb 4.2 oz (3294 g) APGAR: 9, 9  Home with mother.  Selena LesserBraimah, Tina 05/12/2013, 7:38 AM  I have seen and examined this patient and agree the above assessment. CRESENZO-DISHMAN,Nasim Garofano 05/12/2013 8:04 AM

## 2013-05-12 NOTE — Anesthesia Postprocedure Evaluation (Signed)
  Anesthesia Post-op Note  Patient: Jennifer Watson  Procedure(s) Performed: * No procedures listed *  Patient Location: Mother/Baby  Anesthesia Type:Epidural  Level of Consciousness: awake and alert   Airway and Oxygen Therapy: Patient Spontanous Breathing  Post-op Pain: mild  Post-op Assessment: Patient's Cardiovascular Status Stable, Respiratory Function Stable, No signs of Nausea or vomiting, Pain level controlled, No headache and No backache  Post-op Vital Signs: stable  Complications: No apparent anesthesia complications

## 2014-02-26 ENCOUNTER — Encounter (HOSPITAL_COMMUNITY): Payer: Self-pay | Admitting: *Deleted

## 2015-01-10 ENCOUNTER — Other Ambulatory Visit (HOSPITAL_COMMUNITY): Payer: Self-pay | Admitting: Physician Assistant

## 2015-01-10 DIAGNOSIS — Z3A18 18 weeks gestation of pregnancy: Secondary | ICD-10-CM

## 2015-01-10 DIAGNOSIS — Z3689 Encounter for other specified antenatal screening: Secondary | ICD-10-CM

## 2015-01-17 ENCOUNTER — Ambulatory Visit (HOSPITAL_COMMUNITY)
Admission: RE | Admit: 2015-01-17 | Discharge: 2015-01-17 | Disposition: A | Discharge status: Home / Self Care | Payer: Self-pay | Source: Ambulatory Visit | Attending: Physician Assistant | Admitting: Physician Assistant

## 2015-01-17 ENCOUNTER — Encounter (HOSPITAL_COMMUNITY): Payer: Self-pay

## 2015-01-17 DIAGNOSIS — Z36 Encounter for antenatal screening of mother: Secondary | ICD-10-CM | POA: Insufficient documentation

## 2015-01-17 DIAGNOSIS — Z3A18 18 weeks gestation of pregnancy: Secondary | ICD-10-CM | POA: Insufficient documentation

## 2015-01-17 DIAGNOSIS — Z3689 Encounter for other specified antenatal screening: Secondary | ICD-10-CM

## 2015-04-28 NOTE — L&D Delivery Note (Signed)
Delivery Note  Patient presented in active labor after SROM at home at ~09:30 on day of admission. Patient progressed rapidly without augmentation.  At 11:10 AM a viable female was delivered via Vaginal, Spontaneous Delivery (Presentation: ; Occiput Anterior).  APGAR: 9, 9; weight 4156 g.   Placenta status: Intact, Spontaneous.  Cord: 3 vessels with the following complications: continuous trickle after placenta, uterus manually swept x1 no retained products, cytotec 400 buccal given, fundus firm after completion of repair.  Cord pH: not obtained  Anesthesia: Epidural  Episiotomy: None Lacerations: 1st degree Suture Repair: vicryl Est. Blood Loss (mL): 400  Mom to postpartum.  Baby to Couplet care / Skin to Skin.  Cherrie Gauze Shelba Susi 06/02/2015, 11:33 AM

## 2015-05-09 LAB — OB RESULTS CONSOLE GBS: GBS: NEGATIVE

## 2015-06-02 ENCOUNTER — Inpatient Hospital Stay (HOSPITAL_COMMUNITY): Payer: Medicaid Other | Admitting: Anesthesiology

## 2015-06-02 ENCOUNTER — Encounter (HOSPITAL_COMMUNITY): Payer: Self-pay | Admitting: *Deleted

## 2015-06-02 ENCOUNTER — Inpatient Hospital Stay (HOSPITAL_COMMUNITY)
Admission: AD | Admit: 2015-06-02 | Discharge: 2015-06-03 | DRG: 775 | Disposition: A | Discharge status: Home / Self Care | Payer: Medicaid Other | Source: Ambulatory Visit | Attending: Obstetrics & Gynecology | Admitting: Obstetrics & Gynecology

## 2015-06-02 DIAGNOSIS — Z833 Family history of diabetes mellitus: Secondary | ICD-10-CM

## 2015-06-02 DIAGNOSIS — O9902 Anemia complicating childbirth: Secondary | ICD-10-CM | POA: Diagnosis present

## 2015-06-02 DIAGNOSIS — O134 Gestational [pregnancy-induced] hypertension without significant proteinuria, complicating childbirth: Secondary | ICD-10-CM | POA: Diagnosis present

## 2015-06-02 DIAGNOSIS — O480 Post-term pregnancy: Secondary | ICD-10-CM | POA: Diagnosis present

## 2015-06-02 DIAGNOSIS — Z3A40 40 weeks gestation of pregnancy: Secondary | ICD-10-CM

## 2015-06-02 DIAGNOSIS — D649 Anemia, unspecified: Secondary | ICD-10-CM | POA: Diagnosis present

## 2015-06-02 DIAGNOSIS — IMO0001 Reserved for inherently not codable concepts without codable children: Secondary | ICD-10-CM

## 2015-06-02 DIAGNOSIS — E669 Obesity, unspecified: Secondary | ICD-10-CM

## 2015-06-02 DIAGNOSIS — Z809 Family history of malignant neoplasm, unspecified: Secondary | ICD-10-CM

## 2015-06-02 DIAGNOSIS — O99214 Obesity complicating childbirth: Secondary | ICD-10-CM | POA: Diagnosis present

## 2015-06-02 DIAGNOSIS — Z6830 Body mass index (BMI) 30.0-30.9, adult: Secondary | ICD-10-CM | POA: Diagnosis not present

## 2015-06-02 LAB — COMPREHENSIVE METABOLIC PANEL
ALBUMIN: 3 g/dL — AB (ref 3.5–5.0)
ALK PHOS: 134 U/L — AB (ref 38–126)
ALT: 9 U/L — ABNORMAL LOW (ref 14–54)
ANION GAP: 10 (ref 5–15)
AST: 21 U/L (ref 15–41)
BILIRUBIN TOTAL: 0.7 mg/dL (ref 0.3–1.2)
BUN: 7 mg/dL (ref 6–20)
CO2: 20 mmol/L — ABNORMAL LOW (ref 22–32)
Calcium: 8.4 mg/dL — ABNORMAL LOW (ref 8.9–10.3)
Chloride: 107 mmol/L (ref 101–111)
Creatinine, Ser: 0.49 mg/dL (ref 0.44–1.00)
GFR calc Af Amer: >60 mL/min (ref >60)
GFR calc non Af Amer: >60 mL/min (ref >60)
Glucose, Bld: 80 mg/dL (ref 65–99)
POTASSIUM: 4.3 mmol/L (ref 3.5–5.1)
Sodium: 137 mmol/L (ref 135–145)
TOTAL PROTEIN: 6.5 g/dL (ref 6.5–8.1)

## 2015-06-02 LAB — TYPE AND SCREEN
ABO/RH(D): O POS
Antibody Screen: NEGATIVE

## 2015-06-02 LAB — URINALYSIS, ROUTINE W REFLEX MICROSCOPIC
BILIRUBIN URINE: NEGATIVE
GLUCOSE, UA: NEGATIVE mg/dL
Ketones, ur: NEGATIVE mg/dL
Nitrite: NEGATIVE
Protein, ur: NEGATIVE mg/dL
pH: 6 (ref 5.0–8.0)

## 2015-06-02 LAB — PROTEIN / CREATININE RATIO, URINE
CREATININE, URINE: 97 mg/dL
PROTEIN CREATININE RATIO: 0.1 mg/mg{creat} (ref 0.00–0.15)
Total Protein, Urine: 10 mg/dL

## 2015-06-02 LAB — CBC
HEMATOCRIT: 34.5 % — AB (ref 36.0–46.0)
HEMOGLOBIN: 11.7 g/dL — AB (ref 12.0–15.0)
MCH: 30.5 pg (ref 26.0–34.0)
MCHC: 33.9 g/dL (ref 30.0–36.0)
MCV: 89.8 fL (ref 78.0–100.0)
Platelets: 213 10*3/uL (ref 150–400)
RBC: 3.84 MIL/uL — ABNORMAL LOW (ref 3.87–5.11)
RDW: 13 % (ref 11.5–15.5)
WBC: 8 10*3/uL (ref 4.0–10.5)

## 2015-06-02 LAB — URINE MICROSCOPIC-ADD ON: Bacteria, UA: NONE SEEN

## 2015-06-02 MED ORDER — EPHEDRINE 5 MG/ML INJ
10.0000 mg | INTRAVENOUS | Status: DC | PRN
  Filled 2015-06-02: qty 2

## 2015-06-02 MED ORDER — SIMETHICONE 80 MG PO CHEW: 80.0000 mg | CHEWABLE_TABLET | ORAL | Status: DC | PRN

## 2015-06-02 MED ORDER — CITRIC ACID-SODIUM CITRATE 334-500 MG/5ML PO SOLN: 30.0000 mL | ORAL | Status: DC | PRN

## 2015-06-02 MED ORDER — LIDOCAINE HCL (PF) 1 % IJ SOLN
30.0000 mL | INTRAMUSCULAR | Status: DC | PRN
  Filled 2015-06-02: qty 30

## 2015-06-02 MED ORDER — PHENYLEPHRINE 40 MCG/ML (10ML) SYRINGE FOR IV PUSH (FOR BLOOD PRESSURE SUPPORT)
80.0000 ug | PREFILLED_SYRINGE | INTRAVENOUS | Status: DC | PRN
  Filled 2015-06-02: qty 2
  Filled 2015-06-02: qty 20

## 2015-06-02 MED ORDER — OXYTOCIN 10 UNIT/ML IJ SOLN
2.5000 [IU]/h | INTRAVENOUS | Status: DC
  Filled 2015-06-02: qty 4

## 2015-06-02 MED ORDER — ONDANSETRON HCL 4 MG/2ML IJ SOLN: 4.0000 mg | INTRAMUSCULAR | Status: DC | PRN

## 2015-06-02 MED ORDER — ACETAMINOPHEN 325 MG PO TABS: 650.0000 mg | ORAL_TABLET | ORAL | Status: DC | PRN

## 2015-06-02 MED ORDER — BENZOCAINE-MENTHOL 20-0.5 % EX AERO
1.0000 "application " | INHALATION_SPRAY | CUTANEOUS | Status: DC | PRN
  Filled 2015-06-02: qty 56

## 2015-06-02 MED ORDER — SENNOSIDES-DOCUSATE SODIUM 8.6-50 MG PO TABS
2.0000 | ORAL_TABLET | ORAL | Status: DC
  Administered 2015-06-02: 2 via ORAL
  Filled 2015-06-02: qty 2

## 2015-06-02 MED ORDER — WITCH HAZEL-GLYCERIN EX PADS: 1.0000 "application " | MEDICATED_PAD | CUTANEOUS | Status: DC | PRN

## 2015-06-02 MED ORDER — METHYLERGONOVINE MALEATE 0.2 MG PO TABS
0.2000 mg | ORAL_TABLET | Freq: Four times a day (QID) | ORAL | Status: AC
  Administered 2015-06-02 – 2015-06-03 (×4): 0.2 mg via ORAL
  Filled 2015-06-02 (×4): qty 1

## 2015-06-02 MED ORDER — LACTATED RINGERS IV SOLN
500.0000 mL | INTRAVENOUS | Status: DC | PRN
  Administered 2015-06-02: 500 mL via INTRAVENOUS

## 2015-06-02 MED ORDER — LIDOCAINE HCL (PF) 1 % IJ SOLN
INTRAMUSCULAR | Status: DC | PRN
  Administered 2015-06-02: 3 mL
  Administered 2015-06-02: 2 mL via EPIDURAL
  Administered 2015-06-02: 5 mL

## 2015-06-02 MED ORDER — LANOLIN HYDROUS EX OINT
TOPICAL_OINTMENT | CUTANEOUS | Status: DC | PRN
Start: 2015-06-02 — End: 2015-06-03

## 2015-06-02 MED ORDER — ZOLPIDEM TARTRATE 5 MG PO TABS: 5.0000 mg | ORAL_TABLET | Freq: Every evening | ORAL | Status: DC | PRN

## 2015-06-02 MED ORDER — DIBUCAINE 1 % RE OINT
1.0000 | TOPICAL_OINTMENT | RECTAL | Status: DC | PRN
Start: 2015-06-02 — End: 2015-06-03

## 2015-06-02 MED ORDER — TETANUS-DIPHTH-ACELL PERTUSSIS 5-2.5-18.5 LF-MCG/0.5 IM SUSP
0.5000 mL | Freq: Once | INTRAMUSCULAR | Status: DC
Start: 2015-06-03 — End: 2015-06-03

## 2015-06-02 MED ORDER — IBUPROFEN 600 MG PO TABS
600.0000 mg | ORAL_TABLET | Freq: Four times a day (QID) | ORAL | Status: DC
  Administered 2015-06-02 – 2015-06-03 (×4): 600 mg via ORAL
  Filled 2015-06-02 (×4): qty 1

## 2015-06-02 MED ORDER — OXYTOCIN BOLUS FROM INFUSION
500.0000 mL | INTRAVENOUS | Status: DC
  Administered 2015-06-02: 500 mL via INTRAVENOUS

## 2015-06-02 MED ORDER — ONDANSETRON HCL 4 MG PO TABS: 4.0000 mg | ORAL_TABLET | ORAL | Status: DC | PRN

## 2015-06-02 MED ORDER — LACTATED RINGERS IV SOLN
INTRAVENOUS | Status: DC
  Administered 2015-06-02: 10:00:00 via INTRAVENOUS

## 2015-06-02 MED ORDER — ONDANSETRON HCL 4 MG/2ML IJ SOLN: 4.0000 mg | Freq: Four times a day (QID) | INTRAMUSCULAR | Status: DC | PRN

## 2015-06-02 MED ORDER — FENTANYL 2.5 MCG/ML BUPIVACAINE 1/10 % EPIDURAL INFUSION (WH - ANES)
14.0000 mL/h | INTRAMUSCULAR | Status: DC | PRN
  Administered 2015-06-02: 14 mL/h via EPIDURAL
  Filled 2015-06-02: qty 125

## 2015-06-02 MED ORDER — DIPHENHYDRAMINE HCL 25 MG PO CAPS: 25.0000 mg | ORAL_CAPSULE | Freq: Four times a day (QID) | ORAL | Status: DC | PRN

## 2015-06-02 MED ORDER — DIPHENHYDRAMINE HCL 50 MG/ML IJ SOLN: 12.5000 mg | INTRAMUSCULAR | Status: DC | PRN

## 2015-06-02 MED ORDER — MEASLES, MUMPS & RUBELLA VAC ~~LOC~~ INJ
0.5000 mL | INJECTION | Freq: Once | SUBCUTANEOUS | Status: DC
  Filled 2015-06-02: qty 0.5

## 2015-06-02 MED ORDER — MISOPROSTOL 200 MCG PO TABS
ORAL_TABLET | ORAL | Status: AC
  Administered 2015-06-02: 11:00:00
  Filled 2015-06-02: qty 2

## 2015-06-02 MED ORDER — PRENATAL MULTIVITAMIN CH
1.0000 | ORAL_TABLET | Freq: Every day | ORAL | Status: DC
  Administered 2015-06-03: 1 via ORAL
  Filled 2015-06-02: qty 1

## 2015-06-02 NOTE — Lactation Note (Signed)
This note was copied from the chart of Jennifer Watson. Lactation Consultation Note  Patient Name: Jennifer Kimblery Diop [id number] Date: 06/02/2015 Reason for consult: Follow-up assessment;Breast/nipple pain Mom c/o of nipple pain with nursing, RN has given Mom comfort gels. Mom reports she BF her last baby for 1 week and stopped due to same nipple pain. Mom's nipples are semi-flat, short shaft bilateral. Demonstrated hand expression and using hand pump which did make nipples more erect. Assisted Mom with latching baby in football hold on left breast. Mom reports less discomfort PS=5 instead of 6-7 but not resolved. Baby has difficulty keeping bottom lip down and LC notes short, posterior lingual frenulum with exam. When baby came off the breast, compression line visible. Mom's nipple had the "lipstick" appearance. Due to nipple pain initiated #20 nipple shield. Baby took few suckles on right breast then fell asleep. Scant amount of colostrum visible.  Encouraged Mom to pre-pump to help with latch but use nipple shield for next few feedings if pain is decreased so nipples may have opportunity to heal. Look for colostrum in the nipple shield. Encouraged to BF with feeding ques, cluster feeding discussed. Mom was given breast shells to wear to alternate with comfort gels. Apply EBM to sore nipples. Lactation brochure left for review, advised of OP services and support group. Encouraged to call for assist with feedings. Discussed OP f/u and pumping if using nipple shield consisently, will need to re-evaluate in tomorrow.   Maternal Data Has patient been taught Hand Expression?: Yes Does the patient have breastfeeding experience prior to this delivery?: Yes  Feeding Feeding Type: Breast Fed Length of feed: 5 min  LATCH Score/Interventions Latch: Grasps breast easily, tongue down, lips flanged, rhythmical sucking. (using #20 nipple shield) Intervention(s): Adjust position;Assist with  latch;Breast massage;Breast compression  Audible Swallowing: A few with stimulation  Type of Nipple: Everted at rest and after stimulation (short nipple shafts bilateral)  Comfort (Breast/Nipple): Filling, red/small blisters or bruises, mild/mod discomfort  Problem noted: Mild/Moderate discomfort Interventions (Mild/moderate discomfort): Hand massage;Hand expression;Comfort gels  Hold (Positioning): Assistance needed to correctly position infant at breast and maintain latch. Intervention(s): Breastfeeding basics reviewed;Support Pillows;Position options;Skin to skin  LATCH Score: 7  Lactation Tools Discussed/Used Tools: Shells;Nipple Shields;Pump;Comfort gels Nipple shield size: 20 Shell Type: Inverted Breast pump type: Manual WIC Program: Yes   Consult Status Consult Status: Follow-up Date: 06/03/15 Follow-up type: In-patient    Alfred Levins 06/02/2015, 10:23 PM

## 2015-06-02 NOTE — Anesthesia Procedure Notes (Signed)
Epidural Patient location during procedure: OB  Staffing Anesthesiologist: Syrai Gladwin Performed by: anesthesiologist   Preanesthetic Checklist Completed: patient identified, site marked, surgical consent, pre-op evaluation, timeout performed, IV checked, risks and benefits discussed and monitors and equipment checked  Epidural Patient position: sitting Prep: site prepped and draped and DuraPrep Patient monitoring: continuous pulse ox and blood pressure Approach: midline Location: L4-L5 Injection technique: LOR saline  Needle:  Needle type: Tuohy  Needle gauge: 17 G Needle length: 9 cm and 9 Needle insertion depth: 7 cm Catheter type: closed end flexible Catheter size: 19 Gauge Catheter at skin depth: 12 cm Test dose: negative  Assessment Events: blood not aspirated, injection not painful, no injection resistance, negative IV test and no paresthesia   

## 2015-06-02 NOTE — Anesthesia Preprocedure Evaluation (Signed)
Anesthesia Evaluation  Patient identified by MRN, date of birth, ID band Patient awake    Reviewed: Allergy & Precautions, Patient's Chart, lab work & pertinent test results  Airway Mallampati: II  TM Distance: >3 FB     Dental   Pulmonary neg pulmonary ROS,    breath sounds clear to auscultation       Cardiovascular negative cardio ROS   Rhythm:Regular Rate:Normal     Neuro/Psych negative neurological ROS     GI/Hepatic negative GI ROS, Neg liver ROS,   Endo/Other  negative endocrine ROS  Renal/GU negative Renal ROS     Musculoskeletal   Abdominal   Peds  Hematology negative hematology ROS (+)   Anesthesia Other Findings   Reproductive/Obstetrics (+) Pregnancy                             Lab Results  Component Value Date   WBC 8.0 06/02/2015   HGB 11.7* 06/02/2015   HCT 34.5* 06/02/2015   MCV 89.8 06/02/2015   PLT 213 06/02/2015    Anesthesia Physical Anesthesia Plan  ASA: II  Anesthesia Plan: Epidural   Post-op Pain Management:    Induction:   Airway Management Planned:   Additional Equipment:   Intra-op Plan:   Post-operative Plan:   Informed Consent: I have reviewed the patients History and Physical, chart, labs and discussed the procedure including the risks, benefits and alternatives for the proposed anesthesia with the patient or authorized representative who has indicated his/her understanding and acceptance.     Plan Discussed with:   Anesthesia Plan Comments:         Anesthesia Quick Evaluation

## 2015-06-02 NOTE — Anesthesia Postprocedure Evaluation (Signed)
Anesthesia Post Note  Patient: Jennifer Watson  Procedure(s) Performed: * No procedures listed *  Patient location during evaluation: Mother Baby Anesthesia Type: Epidural Level of consciousness: awake and alert Pain management: pain level controlled Vital Signs Assessment: post-procedure vital signs reviewed and stable Respiratory status: spontaneous breathing, nonlabored ventilation and respiratory function stable Cardiovascular status: stable Postop Assessment: no headache, no backache and epidural receding Anesthetic complications: no Comments: Pt stated that epidural did not help a lot with her pain. She stated she knew it was because she didn't get it until she was too close to delivery. She seemed very satisfied with the attempt and reasonable about why her pain management goals were not met.    Last Vitals:  Filed Vitals:   06/02/15 1320 06/02/15 1430  BP:  129/59  Pulse:  70  Temp: 38.2 C 37.8 C  Resp:  18    Last Pain:  Filed Vitals:   06/02/15 1602  PainSc: 2                  Jennifer Watson

## 2015-06-02 NOTE — Progress Notes (Addendum)
POSTPARTUM PROGRESS NOTE  Post Partum Day 0 Subjective:  Called to evaluate fever. Delivered a few hours ago. Denies cough or shortness of breath, denies dysuria or back pain. Feeling well. Nursing reports upper limit of normal bleeding, firm fundus.  Objective: Blood pressure 129/59, pulse 70, temperature 100.1 F (37.8 C), temperature source Axillary, resp. rate 18, height  (1.651 m), weight 185 lb 4.8 oz (84.052 kg), unknown if currently breastfeeding.  Physical Exam:  General: alert, cooperative and no distress Chest: CTAB Heart: RRR no m/r/g Abdomen: +BS, soft, nontender,  Uterine Fundus: firm DVT Evaluation: No calf swelling or tenderness Extremities: trace edema   Recent Labs  06/02/15 1000  HGB 11.7*  HCT 34.5*    Assessment/Plan:  ASSESSMENT: Jennifer Watson is a 25 y.o. [id number] [redacted]w[redacted]d s/p NSVD.  # Fever: Isolated. F/u temp elevated but not actual fever. Well appearing, no signs/symptoms lung or bladder infection. No prolonged rom. Did have manual sweep x1 after delivery for possible retained membrane (there was none). - check urinalysis - hold on antibiotics given well appearance and as is immediately postpartum. If fever persists or develops symptoms of localized infection would treat  # Bleeding: multiparous, 400 ml ebl, has received pitocin and buccal cytotec. Had manual sweep of uterus postpartu, no retained products. - methergine 0.2 mg po q6 hours for 24 hours  # Elevated BP - in the setting of active labor. Since delivery BP has normalized. preE labs wnl - continue to monitor, OK with starting methergine as above    LOS: 0 days   Silvano Bilis 06/02/2015, 3:06 PM

## 2015-06-02 NOTE — H&P (Signed)
LABOR AND DELIVERY ADMISSION HISTORY AND PHYSICAL NOTE  Jennifer Watson is a 25 y.o. female [phone] with IUP at [redacted]w[redacted]d by 21 wk u/s presenting for contractions and SROM 09:00 at home. Felt a gush at that time and continues to leak. Contractions started early in morning, now every 4 minutes apart and painful. No bleeding, normal fetal movement.   No ha, vision change, ruq pain, or sob  Prenatal History/Complications:  Past Medical History: Past Medical History  Diagnosis Date  . Medical history non-contributory     Past Surgical History: Past Surgical History  Procedure Laterality Date  . No past surgeries      Obstetrical History: OB History    Gravida Para Term Preterm AB TAB SAB Ectopic Multiple Living   Social History: Social History   Social History  . Marital Status: Married    Spouse Name: N/A  . Number of Children: N/A  . Years of Education: N/A   Social History Main Topics  . Smoking status: Never Smoker   . Smokeless tobacco: None  . Alcohol Use: No  . Drug Use: No  . Sexual Activity: Yes     Comment: pregnant   Other Topics Concern  . None   Social History Narrative    Family History: Family History  Problem Relation Age of Onset  . Cancer Maternal Grandfather   . Diabetes Paternal Grandmother     Allergies: No Known Allergies  Prescriptions prior to admission  Medication Sig Dispense Refill Last Dose  . Prenatal Vit-Fe Fumarate-FA (PRENATAL MULTIVITAMIN) TABS tablet Take 1 tablet by mouth daily at 12 noon.   Past Week at Unknown time     Review of Systems   All systems reviewed and negative except as stated in HPI  Blood pressure 131/86, pulse 76, temperature 98.3 F (36.8 C), temperature source Oral, resp. rate 18, height  (1.651 m), weight 185 lb 4.8 oz (84.052 kg), unknown if currently breastfeeding. General appearance: alert, cooperative, appears stated age, mild distress and mildly obese Lungs:  clear to auscultation bilaterally Heart: regular rate and rhythm Abdomen: soft, non-tender; bowel sounds normal Extremities: No calf swelling or tenderness Presentation: cephalic per RN exam Fetal monitoring: 140/mod/+a/-d Uterine activity: q 4 min  Dilation: 4.5 Effacement (%): 90 Station: -1 Exam by:: Vira Blanco, RN   Prenatal labs: ABO, Rh:  o pos Antibody:  neg Rubella: immune RPR:   neg HBsAg:   neg HIV:   neg GBS: Negative (01/12 0945)  1 hr Glucola: 114 Genetic screening:  Quad wnl Anatomy US: wnl  Prenatal Transfer Tool  Maternal Diabetes: No Genetic Screening: Normal Maternal Ultrasounds/Referrals: Normal Fetal Ultrasounds or other Referrals:  None Maternal Substance Abuse:  No Significant Maternal Medications:  None Significant Maternal Lab Results: Lab values include: Group B Strep negative  Results for orders placed or performed during the hospital encounter of 06/02/15 (from the past 24 hour(s))  CBC   Collection Time: 06/02/15 10:00 AM  Result Value Ref Range   WBC 8.0 4.0 - 10.5 K/uL   RBC 3.84 (L) 3.87 - 5.11 MIL/uL   Hemoglobin 11.7 (L) 12.0 - 15.0 g/dL   HCT 45.4 (L) 09.8 - 11.9 %   MCV 89.8 78.0 - 100.0 fL   MCH 30.5 26.0 - 34.0 pg   MCHC 33.9 30.0 - 36.0 g/dL   RDW 14.7 82.9 - 56.2 %   Platelets 213 150 -  400 K/uL    Patient Active Problem List   Diagnosis Date Noted  . Active labor at term 06/02/2015  . Active labor 05/11/2013    Assessment: Jennifer Watson is a 25 y.o. Z6X0960 at [redacted]w[redacted]d here for SOL with SROM at home.  #Labor: expectant #Pain: epidural #FWB: Cat 1 #ID:  gbs neg #MOF: breast #MOC: nexplanon #Circ:  N/a # Elevated BP: mildly elevated, may be 2/2 pain, but does have hx ghtn. Asymptomatic. Will continue to monitor and will check preE labs.  Cherrie Gauze Keauna Brasel 06/02/2015, 10:45 AM

## 2015-06-02 NOTE — MAU Note (Signed)
Water broke around 9am. Clear fluid. Contractions for 4 hrs

## 2015-06-03 LAB — RPR: RPR: NONREACTIVE

## 2015-06-03 MED ORDER — IBUPROFEN 600 MG PO TABS: 600.0000 mg | ORAL_TABLET | Freq: Four times a day (QID) | ORAL | Status: AC

## 2015-06-03 MED ORDER — ACETAMINOPHEN 325 MG PO TABS: 650.0000 mg | ORAL_TABLET | ORAL | Status: AC | PRN

## 2015-06-03 NOTE — Discharge Instructions (Signed)

## 2015-06-03 NOTE — Progress Notes (Signed)
Post Partum Day 1  Subjective:  Jennifer Watson is a 25 y.o. Z6X0960 [redacted]w[redacted]d s/p SVD.  No acute events overnight.  Pt denies problems with ambulating, voiding or po intake.  Shedenies nausea or vomiting.  Pain is well controlled with scheduled Ibuprofen.  She has had flatus. She has not had bowel movement.  Lochia small. She soaked three pads overnight.  Plan for birth control is Nexplanon.  Method of Feeding: breast.  Objective: BP 101/59 mmHg  Pulse 59  Temp(Src) 98 F (36.7 C) (Oral)  Resp 16  Ht  (1.651 m)  Wt 84.052 kg (185 lb 4.8 oz)  BMI 30.84 kg/m2  Breastfeeding? Unknown  Physical Exam:  General: alert, cooperative and no distress Lochia:normal flow Chest: CTAB Heart: RRR no m/r/g Abdomen: +BS, soft, nontender, fundus firm at/below umbilicus Uterine Fundus: firm, DVT Evaluation: No evidence of DVT seen on physical exam. Extremities: +1 edema   Recent Labs  06/02/15 1000  HGB 11.7*  HCT 34.5*    Assessment/Plan:  ASSESSMENT: Jennifer Watson is a 25 y.o. [id number] [redacted]w[redacted]d ppd #1 s/p SVD doing well.  1) PPD #1: Encouraged continued ambulation. Pain is well controlled with scheduled Ibuprofen .  2) Breastfeeding 3) Nexplanon   4) gHTN: Blood pressures appear to be trending down.  5) Anemia: Hb and Hct low but stable. Consider stopping methergine 0.2 mg and consider iron supplementation.  Plan for discharge tomorrow   LOS: 1 day   Esmond Camper 06/03/2015, 7:10 AM

## 2015-06-03 NOTE — Lactation Note (Signed)
This note was copied from the chart of Jennifer Watson. Lactation Consultation Note  Patient Name: Jennifer Watson [id number] Date: 06/03/2015 Reason for consult: Follow-up assessment Baby 26 hours old. Mom all packed up standing in room and dressed in her coat ready to leave the hospital. Mom states that the baby is nursing better and her breasts are starting to feel heavier. Discussed engorgement prevention/treatment. Mom is currently wearing her breast shells.  Enc mom to nurse with cues. Referred mom to baby and me booklet for number of diapers to expect by day of life and reasons to call the pediatrician. Mom aware of OP/BFSG and LC phone line assistance after D/C.   Maternal Data    Feeding Length of feed: 15 min  LATCH Score/Interventions Latch: Grasps breast easily, tongue down, lips flanged, rhythmical sucking.  Audible Swallowing: Spontaneous and intermittent Intervention(s): Skin to skin;Hand expression  Type of Nipple: Everted at rest and after stimulation  Comfort (Breast/Nipple): Filling, red/small blisters or bruises, mild/mod discomfort  Problem noted: Cracked, bleeding, blisters, bruises;Mild/Moderate discomfort  Hold (Positioning): Assistance needed to correctly position infant at breast and maintain latch.  LATCH Score: 8  Lactation Tools Discussed/Used     Consult Status Consult Status: Complete    Nancy Nordmann, Jennifer Watson 06/03/2015, 1:22 PM

## 2015-06-03 NOTE — Discharge Summary (Signed)
OB Discharge Summary     Patient Name: Jennifer Watson DOB: 19-Jul-1990 MRN: [id number]  Date of admission: 06/02/2015 Delivering MD: Shonna Chock [place]   Date of discharge: 06/03/2015  Admitting diagnosis: 40wks contraction water broke Intrauterine pregnancy: [redacted]w[redacted]d     Secondary diagnosis:  Active Problems:   Active labor at term  Additional problems: h/o gHTN (on asa)     Discharge diagnosis: Term Pregnancy Delivered                                                                                                Post partum procedures:none  Augmentation: none  Complications: None  Hospital course:  Onset of Labor With Vaginal Delivery     25 y.o. yo [id number] at [redacted]w[redacted]d was admitted in Active Labor on 06/02/2015. Patient had an uncomplicated labor course as follows:  Membrane Rupture Time/Date: 9:20 AM ,06/02/2015   Intrapartum Procedures: Episiotomy: None [1]                                         Lacerations:  1st degree [2]  Patient had a delivery of a Viable infant. 06/02/2015  Information for the patient's newborn:  Kimi, [place] [[id number]]  Delivery Method: Vaginal, Spontaneous Delivery (Filed from Delivery Summary)    Pt had EBL 420 intrapartum - received cytotec and PO methergine x24 hrs - bleeding rate slowed, stable on dc.  She also had elevated BP on admission that improved (preeclampsia labs wnl) and a one-time fever T 101.3 shortly after delivery that resolved spontaneously. No other signs/symptoms infection.  Otherwise pateint had an uncomplicated postpartum course.  She is ambulating, tolerating a regular diet, passing flatus, and urinating well. Patient is discharged home in stable condition on 06/03/2015.    Physical exam  Filed Vitals:   06/02/15 2125 06/03/15 0150 06/03/15 0325 06/03/15 0640  BP: 127/60 138/71 118/66 101/59  Pulse: 66 60 62 59  Temp: 98.3 F (36.8 C) 98.3 F (36.8 C)  98 F (36.7 C)  TempSrc: Oral Oral  Oral  Resp: Height:      Weight:       General: alert, cooperative and no distress Lochia: appropriate Uterine Fundus: firm Incision: N/A DVT Evaluation: No evidence of DVT seen on physical exam. Labs: Lab Results  Component Value Date   WBC 8.0 06/02/2015   HGB 11.7* 06/02/2015   HCT 34.5* 06/02/2015   MCV 89.8 06/02/2015   PLT 213 06/02/2015   CMP Latest Ref Rng 06/02/2015  Glucose 65 - 99 mg/dL 80  BUN 6 - 20 mg/dL 7  Creatinine 6.57 - 8.46 mg/dL 9.62  Sodium 952 - 841 mmol/L 137  Potassium 3.5 - 5.1 mmol/L 4.3  Chloride 101 - 111 mmol/L 107  CO2 22 - 32 mmol/L 20(L)  Calcium 8.9 - 10.3 mg/dL 3.2(G)  Total Protein 6.5 - 8.1 g/dL 6.5  Total Bilirubin 0.3 - 1.2 mg/dL 0.7  Alkaline Phos 38 - 126 U/L 134(H)  AST 15 - 41 U/L 21  ALT 14 - 54 U/L 9(L)    Discharge instruction: per After Visit Summary and "Baby and Me Booklet".  After visit meds:    Medication List    TAKE these medications        acetaminophen 325 MG tablet  Commonly known as:  TYLENOL  Take 2 tablets (650 mg total) by mouth every 4 (four) hours as needed (for pain scale < 4).     aspirin 81 MG tablet  Take 81 mg by mouth daily.     ibuprofen 600 MG tablet  Commonly known as:  ADVIL,MOTRIN  Take 1 tablet (600 mg total) by mouth every 6 (six) hours.     prenatal multivitamin Tabs tablet  Take 1 tablet by mouth daily at 12 noon.        Diet: routine diet  Activity: Advance as tolerated. Pelvic rest for 6 weeks.   Outpatient follow up:6 weeks Follow up Appt:No future appointments. Follow up Visit:No Follow-up on file.  Follow-up Information    Follow up with [facility]. Schedule an appointment as soon as possible for a visit in 6 weeks.   Why:  post partum check   Contact information:   [address] [place] [address] [phone]        Postpartum contraception: Nexplanon  Newborn Data: Live born female  Birth Weight: 9 lb 2.6 oz (4156 g) APGAR: 9, 9  Baby  Feeding: Breast Disposition:home with mother   06/03/2015 Wynne Dust, MD   OB FELLOW DISCHARGE ATTESTATION  I have seen and examined this patient and agree with above documentation in the resident's note.   Silvano Bilis, MD 7:49 AM   OB FELLOW DISCHARGE ATTESTATION  I have seen and examined this patient and agree with above documentation in the resident's note.   Silvano Bilis, MD 7:50 AM

## 2015-06-04 LAB — CULTURE, OB URINE: Culture: 8000

## 2015-06-04 NOTE — Progress Notes (Signed)
Post discharge chart review completed.  

## 2016-11-19 IMAGING — US US MFM OB COMP +14 WKS
1 series · 14 of 28 positions shown · non-contrast
Comparison: none

[Series 1: us mfm ob comp +14 wks · 14 of 76 slices shown]
[im 3/76]
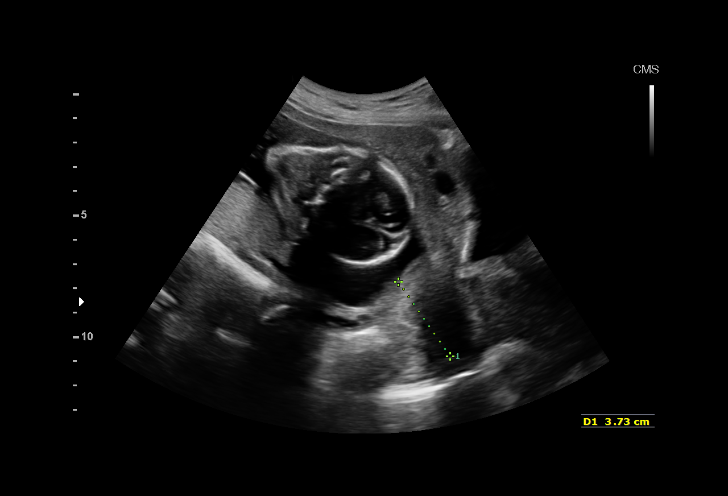
[im 9/76]
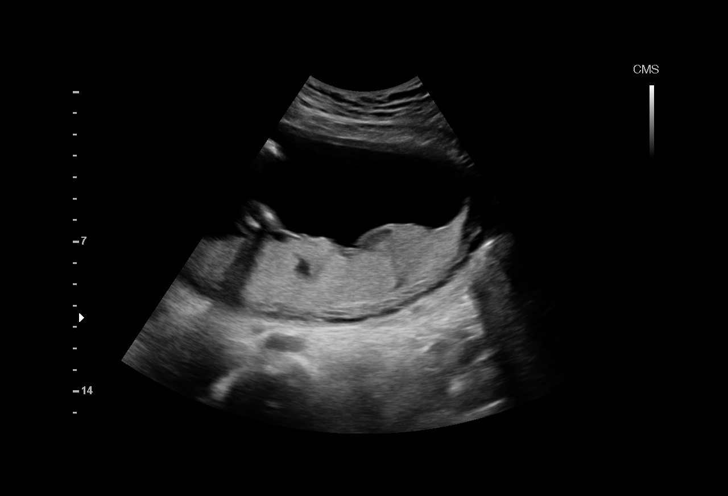
[im 14/76]
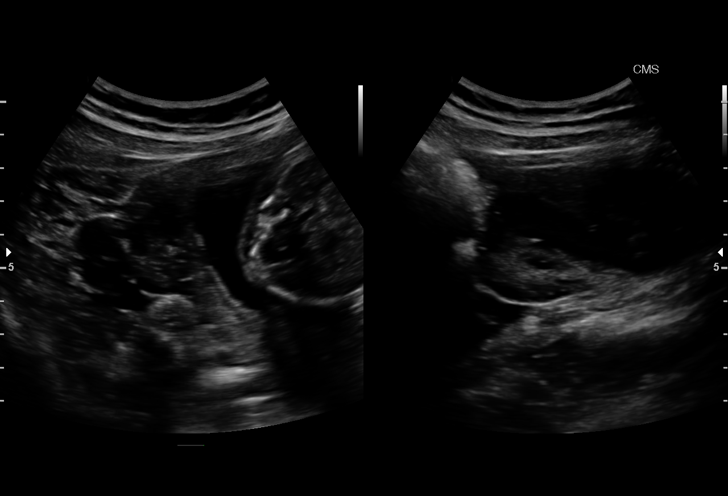
[im 20/76]
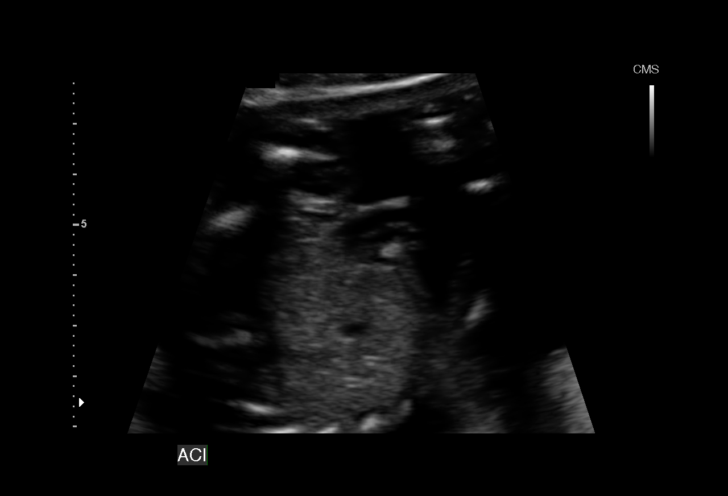
[im 26/76]
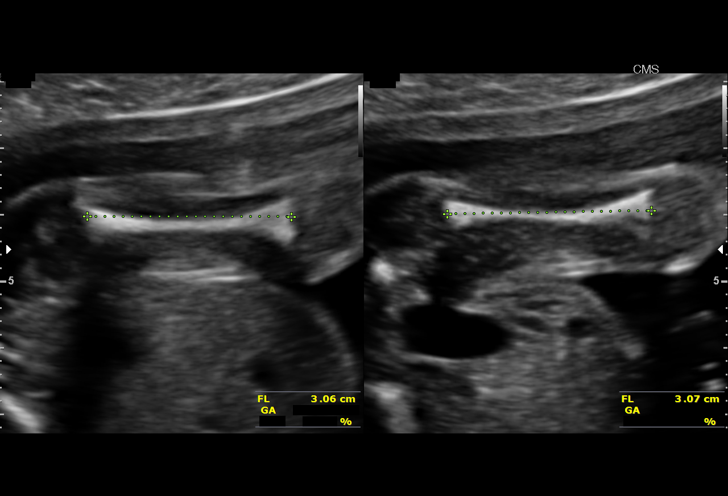
[im 31/76]
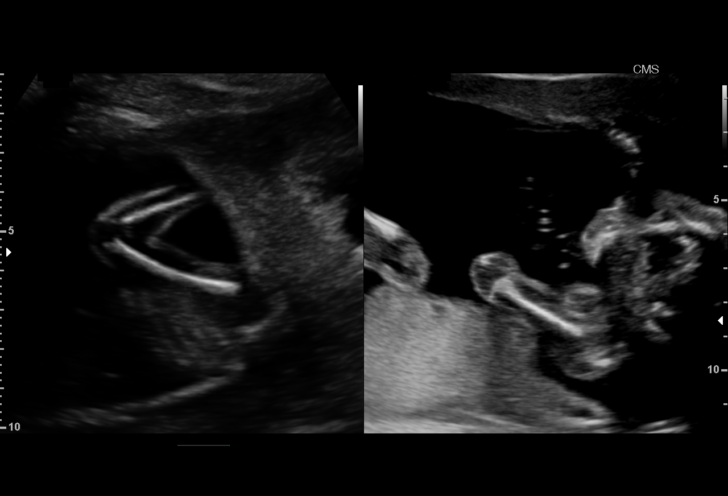
[im 37/76]
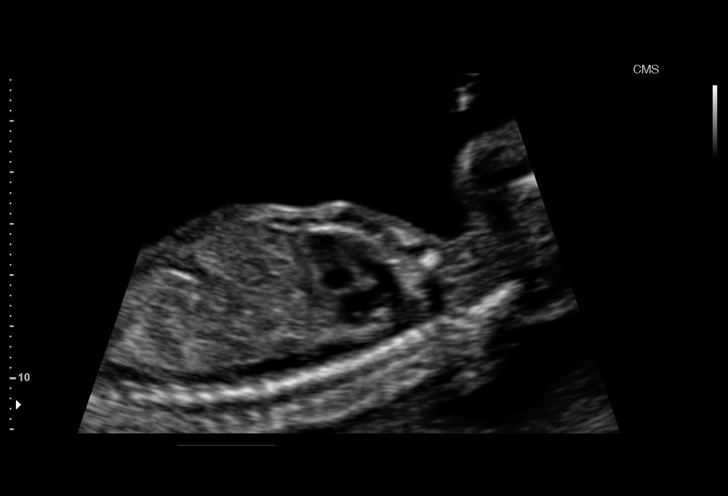
[im 42/76]
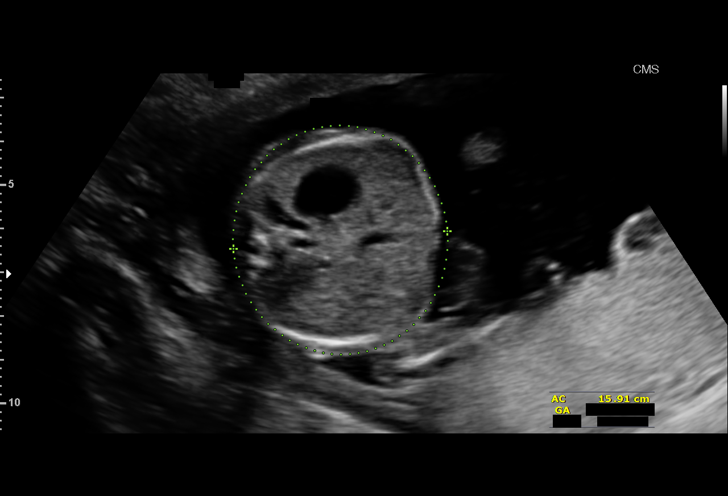
[im 48/76]
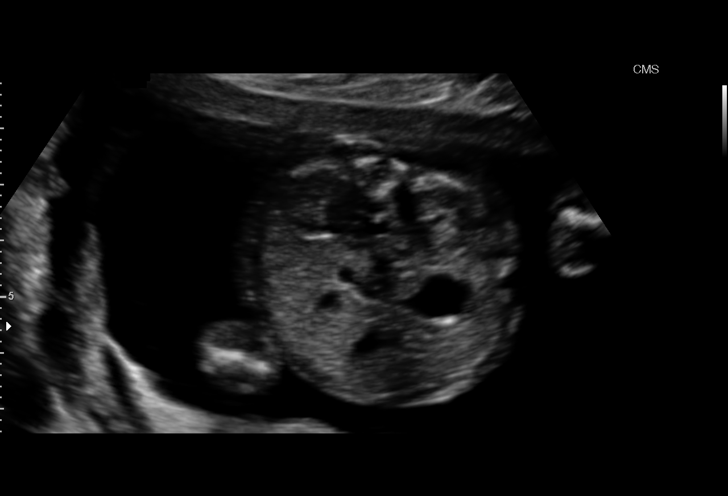
[im 53/76]
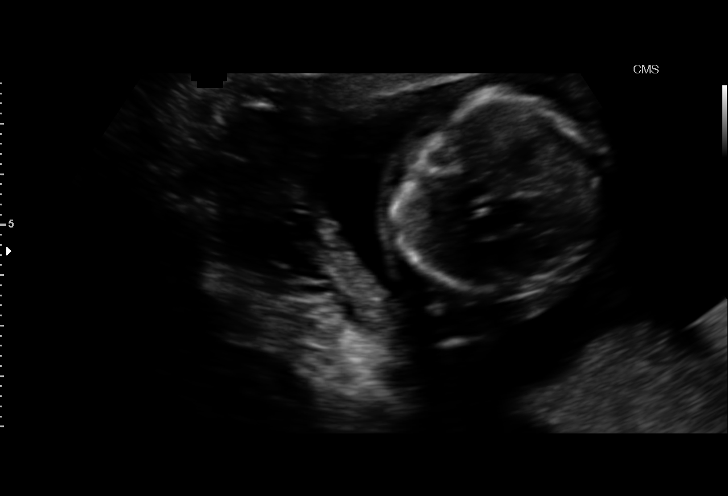
[im 59/76]
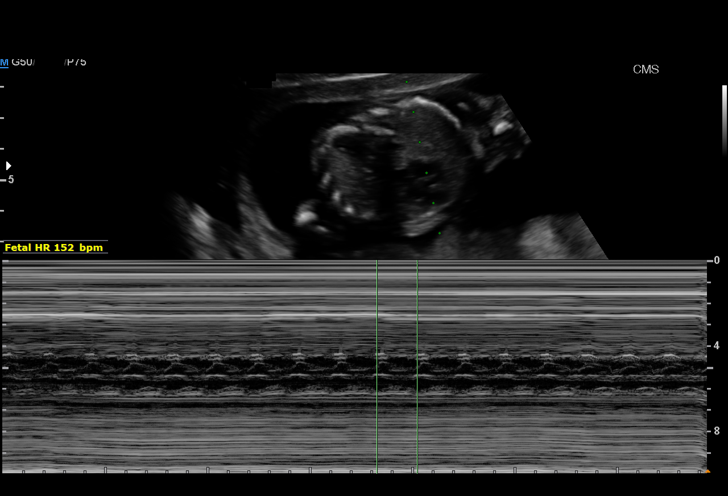
[im 64/76]
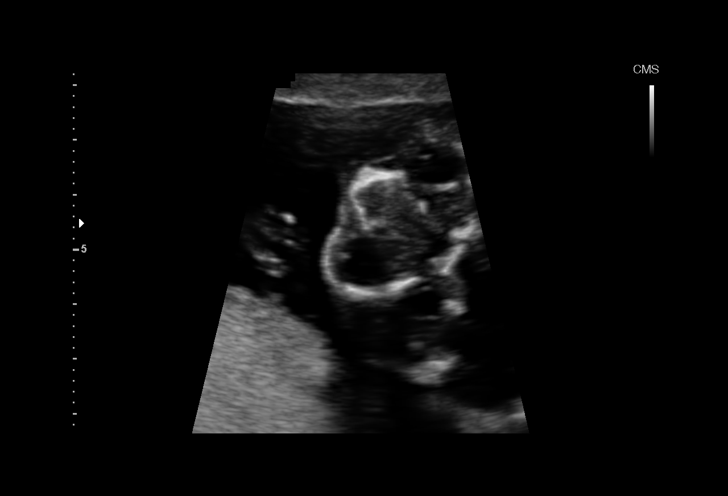
[im 70/76]
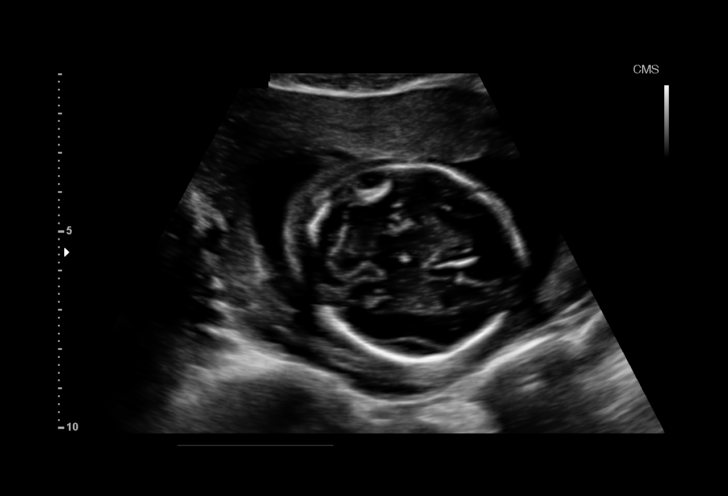
[im 76/76]
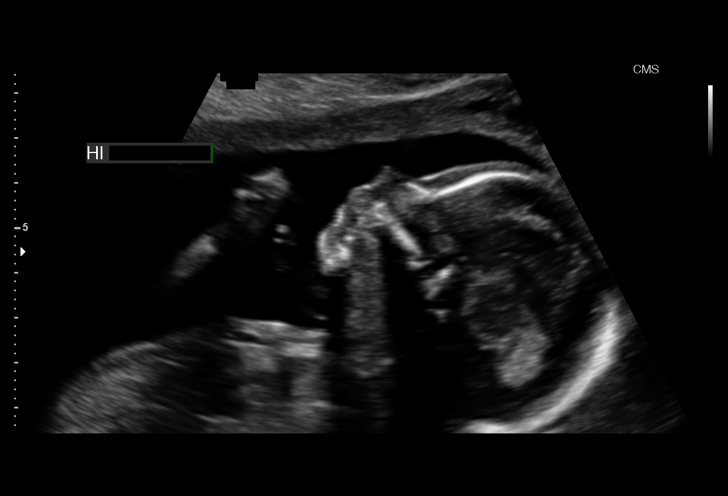

[14 of 28 positions shown; findings below may reference images not displayed]

OBSTETRICS REPORT
(Signed Final 01/17/2015 [DATE])

Service(s) Provided

Indications

Basic anatomic survey                                 Z36
Uncertain LMP,  Establish Gestational Age             Z36
20 weeks gestation of pregnancy
Fetal Evaluation

Num Of Fetuses:    1
Fetal Heart Rate:  152                          bpm
Cardiac Activity:  Observed
Presentation:      Cephalic
Biometry

BPD:     49.4   mm    G. Age:  20w 6d                CI:        73.77    70 - 86
FL/HC:       16.7   15.9 -
20.3
HC:     182.7   mm    G. Age:  20w 5d       46   %   HC/AC:       1.14   1.06 -
1.25
AC:     159.8   mm    G. Age:  21w 1d       61   %   FL/BPD:
FL:      30.6   mm    G. Age:  19w 3d       11   %   FL/AC:       19.1   20 - 24

Est. FW:     351   gm    0 lb 12 oz     43  %
Gestational Age

U/S Today:     20w 4d                                        EDD:    06/02/15
Best:          20w 4d     Det. By:  U/S (01/17/15)           EDD:    06/02/15
Anatomy

Cranium:          Appears normal         Aortic Arch:       Appears normal
Fetal Cavum:      Appears normal         Ductal Arch:       Appears normal
Ventricles:       Appears normal         Diaphragm:         Appears normal
Choroid Plexus:   Appears normal         Stomach:           Appears normal, left
sided
Cerebellum:       Appears normal         Abdomen:           Appears normal
Posterior Fossa:  Appears normal         Abdominal Wall:    Appears nml (cord
insert, abd wall)
Nuchal Fold:      Appears normal         Cord Vessels:      Appears normal (3
vessel cord)
Face:             Appears normal         Kidneys:           Appear normal
(orbits and profile)
Lips:             Appears normal         Bladder:           Appears normal
Heart:            Appears normal         Spine:             Appears normal
(4CH, axis, and
situs)
RVOT:             Appears normal         Lower              Appears normal
Extremities:
LVOT:             Appears normal         Upper              Appears normal
Extremities:

Other:  Fetus appears to be a female. Heels and 5th digit visualized. Nasal
bone visualized.
Targeted Anatomy

Fetal Central Nervous System
Cisterna Magna:
Cervix Uterus Adnexa

Cervical Length:    3.73      cm

Cervix:       Normal appearance by transabdominal scan.
Uterus:       No abnormality visualized.
Cul De Sac:   No free fluid seen.
Left Ovary:    Within normal limits.
Right Ovary:   Within normal limits.

Adnexa:     No abnormality visualized.
Impression

SIUP at 20+4 weeks
Normal detailed fetal anatomy
Markers of aneuploidy: none
Normal amniotic fluid volume
EDC based on today's measurements
Recommendations

Follow-up as clinically indicated
